# Patient Record
Sex: Female | Born: 1955 | Race: White | Hispanic: No | Marital: Married | Smoking: Never smoker
Health system: Southern US, Community
[De-identification: ages and names within clinical notes are randomized; demographics above are authoritative.]

## PROBLEM LIST (undated history)

## (undated) DIAGNOSIS — I639 Cerebral infarction, unspecified: Secondary | ICD-10-CM

## (undated) DIAGNOSIS — M1990 Unspecified osteoarthritis, unspecified site: Secondary | ICD-10-CM

## (undated) DIAGNOSIS — I10 Essential (primary) hypertension: Secondary | ICD-10-CM

## (undated) HISTORY — PX: OTHER SURGICAL HISTORY: SHX169

## (undated) HISTORY — PX: ABDOMINAL HYSTERECTOMY: SHX81

## (undated) HISTORY — PX: CERVICAL DISCECTOMY: SHX98

---

## 1999-12-30 ENCOUNTER — Other Ambulatory Visit: Admission: RE | Admit: 1999-12-30 | Discharge: 1999-12-30 | Payer: Self-pay | Admitting: Obstetrics & Gynecology

## 2000-04-25 ENCOUNTER — Encounter (INDEPENDENT_AMBULATORY_CARE_PROVIDER_SITE_OTHER): Payer: Self-pay

## 2000-04-25 ENCOUNTER — Other Ambulatory Visit: Admission: RE | Admit: 2000-04-25 | Discharge: 2000-04-25 | Payer: Self-pay | Admitting: Obstetrics & Gynecology

## 2000-11-22 ENCOUNTER — Other Ambulatory Visit: Admission: RE | Admit: 2000-11-22 | Discharge: 2000-11-22 | Payer: Self-pay | Admitting: Obstetrics & Gynecology

## 2007-01-06 ENCOUNTER — Ambulatory Visit (HOSPITAL_COMMUNITY): Admission: RE | Admit: 2007-01-06 | Discharge: 2007-01-06 | Payer: Self-pay | Admitting: Cardiology

## 2007-07-07 ENCOUNTER — Ambulatory Visit (HOSPITAL_COMMUNITY): Admission: RE | Admit: 2007-07-07 | Discharge: 2007-07-08 | Payer: Self-pay | Admitting: Cardiology

## 2008-07-03 ENCOUNTER — Ambulatory Visit (HOSPITAL_COMMUNITY): Admission: RE | Admit: 2008-07-03 | Discharge: 2008-07-04 | Payer: Self-pay | Admitting: Neurosurgery

## 2010-12-29 NOTE — Op Note (Signed)
NAMELEVORA, WERDEN                  ACCOUNT NO.:  [id number]   MEDICAL RECORD NO.:  [id number]          PATIENT TYPE:  OIB   LOCATION:  3523                         FACILITY:  [facility]   PHYSICIAN:  Hilda Lias, M.D.   DATE OF BIRTH:  1956-04-09   DATE OF PROCEDURE:  07/03/2008  DATE OF DISCHARGE:                               OPERATIVE REPORT   PREOPERATIVE DIAGNOSIS:  C4-C5, C5-C6 spondylosis with radiculopathy.   POSTOPERATIVE DIAGNOSIS:  C4-C5, C5-C6 spondylosis with radiculopathy.   PROCEDURE:  Anterior C4-5, C5-6 diskectomy, decompression of the spinal  cord, bilateral foraminotomy, interbody fusion with auto and allograft  plate, microscope.   SURGEON:  Hilda Lias, MD   ASSISTANT:  Coletta Memos, MD   CLINICAL HISTORY:  Ms. Clendenning is a lady who was in my office complaining  of neck pain worse in the left upper extremity, which is getting worse.  It started with a burning sensation on the thumb and index finger.  The  patient had conservative treatment without any improvement.  X-rays  showed spondylosis at the level of C4-5 and C5-6, a borderline at the  level of C6-7.  Surgery was advised.  The risks were explained to her  and her husband including possibility of no improvement whatsoever, need  for surgery, and failure of the plate, stroke, infection, and CSF leak.   PROCEDURE:  Ms. Buffalo was taken to the OR and after intubation, the left  side of the neck was cleaned with DuraPrep.  Drapes were applied.  Transverse incision was made through the skin, subcutaneous tissue, and  platysma.  Dissection was carried out.  We found 2 large anterior  osteophytes.  X-rays showed that the needle was at the level of C4-5.  From thereon, we removed the anterior osteophyte at C4-5 and C5-6.  We  brought the microscope into the area, we started at the level of C4-5.  There was almost no space, the anterior ligament was calcified.  Incision was made with a 15-blade to enter the disk  space, we had to use  the drill.  The drill was carried out all the way posteriorly.  A thick  ligament was also excised in the midline and decompression of the spinal  cord as well as the foramen was achieved.  At the end, we had plenty  room for the spinal cord as well as the level at both C5 nerve root.  At  the level of C5-6, we saw an anterior ligament also with incision, after  it was done, we entered the disk space and with the microscope and with  the drill, we drilled all the way posteriorly.  We found the posterior  ligament was calcified and it was large, attached to the dura mater at  the right paramedian level.  Incision was done using the 1-mm Kerrison  punch as well as the drill.  We were able to remove the ligament with  decompression of the C5-6 nerve root.  The dura mater was intact.  Then,  decompression of the left side was done and we found quite  a bit of  narrowing of the foramen in the left side.  At the end, we had good room  for both the C6 nerve root as well as spinal  cord.  The endplates were drilled and 2 pieces of allograft of 7 mm with  autograft and PBX was inserted.  This was followed by a plate using 5  screws.  Lateral cervical spine x-rays showed that the upper part of the  plate was in good position.  We accomplished good hemostasis, and then  the wound was closed with Vicryl and Steri-Strips.           ______________________________  Hilda Lias, M.D.     EB/MEDQ  D:  07/03/2008  T:  07/03/2008  Job:  [id number]

## 2010-12-29 NOTE — Cardiovascular Report (Signed)
NAME:  Sylvia Palmer                  ACCOUNT NO.:  [id number]   MEDICAL RECORD NO.:  [id number]          PATIENT TYPE:  OIB   LOCATION:  6531                         FACILITY:  [facility]   PHYSICIAN:  Vonna Kotyk R. Jacinto Halim, MD       DATE OF BIRTH:  [birth year]   DATE OF PROCEDURE:  07/07/2007  DATE OF DISCHARGE:  07/08/2007                            CARDIAC CATHETERIZATION   PROCEDURE PERFORMED:  Intracardiac echo-guided closure of atrial septal  defect.   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  Given her RV dilatation, we felt that the  closure of the fenestrated ASD and also PFO which was fairly large with  a very strongly positive right-to-left shunting by  double contrast and  color Doppler evaluation.  The patient was brought to the  catheterization lab for the atrial septal defect closure.   The RA saturation was 72% with an SVC saturation of 69%.  The PA  saturation was also measured at 69%; however, this was an error.  There  was significant step-up noted in the right atrium, SVC to right atrium,  suggestive of significant left-to-right shunting.   DATA:  The intracardiac echo data revealed a large patent foramen ovale  and also multiple fenestrated atrial septal defects.  There were 2 large  fenestrated defects, one very close to the PFO and one in the mid  septum, and another small-sized PFO in the posterior part of the  interatrial septum near the origin of the IVC.  Otherwise the cardiac  structures were normal.   INTERVENTION DATA:  Successful closure of the PFO and fenestrated atrial  septal defect with implantation of a 25 mm Helix septal occluder.  Post  deployment, excellent results were noted with closure of  PFO, three of  the fenestrated atrial septa, and also there was no residual right-to-  left shunting by double contrast or color Doppler evaluation.   There was a small residual fenestrated ASD that was left alone that was  posterior and close to the IVC.   I do not suspect that this defect is physiologically significant.   RECOMMENDATIONS:  The patient will be continued on aspirin indefinitely  and Plavix for at least a period of 3 months.  She will need  endocarditis prophylaxis for a period of 6 months.  Outpatient  echocardiogram will also be performed.  Her right ventricular dimension  will be closely followed and if she does indeed start to develop any  right ventricular enlargement or right ventricular dilatation or  enlargement, I could potentially consider her for the closure of the  small-size fenestrated atrial septal defect; however, my clinical  suspicion is that this is probably insignificant.   TECHNIQUE:  Proceeded with the usual sterile precautions using a 9-  French right femoral venous access and an 8-French right femoral venous  access.  Intracardiac echo probe was advanced throught the 9-French  sheath.  An intracardiac echocardiogram was performed and the structures  were carefully analyzed.  Using a multipurpose A2 catheter, I was able  to obtain SVC, RA, IVC saturations and also RV and PA saturations;  however, having obtained this, I had initially noted step-up from the  SVC to RA, hence, I decided to proceed with closure.  There was an error  in the estimation of the PA saturation; however, repeat right heart  catheterization was not performed as we had already entered the left  atrium through a large PFO.   I made multiple attempts to see if I could go through the central  fenestrated atrial septal defect.  Because of inability to do so, I was  able to go through a large PFO which measured out to be about 11 mm.  At  this point, we decided to implant a  25-mm Helix septal occluder, which  was deployed in the usual fashion after preparing and exchanging the 8-  [place] venous sheath to an 11-French sheath.  Post closure, a double  contrast color Doppler evaluation was also performed.  The Helix device  was released.  The patient tolerated the procedure.  There were no  immediate complications noted.  ACT was maintained at therapeutic range  during the entirety of the procedure.  Heparin was utilized for  anticoagulation.      Cristy Hilts. Jacinto Halim, MD  Electronically Signed     JRG/MEDQ  D:  07/07/2007  T:  07/08/2007  Job:  [id number]

## 2011-05-18 LAB — BASIC METABOLIC PANEL
BUN: 10
Calcium: 9.6
Chloride: 108
Creatinine, Ser: 1.13

## 2011-05-18 LAB — CBC
HCT: 38.7
MCHC: 33.6
MCV: 88.2

## 2011-05-25 LAB — POCT I-STAT 3, VENOUS BLOOD GAS (G3P V)
Acid-base deficit: 3 — ABNORMAL HIGH
Acid-base deficit: 4 — ABNORMAL HIGH
Acid-base deficit: 5 — ABNORMAL HIGH
Bicarbonate: 20.7
Bicarbonate: 21.2
Bicarbonate: 21.2
Bicarbonate: 22.2
O2 Saturation: 59
O2 Saturation: 63
O2 Saturation: 75
O2 Saturation: 75
Operator id: 256671
Operator id: 256671
Operator id: 256671
TCO2: 22
TCO2: 22
TCO2: 22
TCO2: 23
pCO2, Ven: 38.1 — ABNORMAL LOW
pCO2, Ven: 39.3 — ABNORMAL LOW
pH, Ven: 7.354 — ABNORMAL HIGH
pH, Ven: 7.359 — ABNORMAL HIGH
pO2, Ven: 32
pO2, Ven: 34
pO2, Ven: 43

## 2011-05-25 LAB — BASIC METABOLIC PANEL
CO2: 24
GFR calc Af Amer: >60
Sodium: 136

## 2011-05-25 LAB — CBC
HCT: 34.3 — ABNORMAL LOW
Hemoglobin: 11.7 — ABNORMAL LOW
MCHC: 34.2
MCV: 87.6
Platelets: 404 — ABNORMAL HIGH
RBC: 3.92
RDW: 13.3
WBC: 9.8

## 2011-12-28 ENCOUNTER — Other Ambulatory Visit: Payer: Self-pay | Admitting: Neurosurgery

## 2011-12-28 DIAGNOSIS — IMO0002 Reserved for concepts with insufficient information to code with codable children: Secondary | ICD-10-CM

## 2011-12-28 DIAGNOSIS — M545 Low back pain, unspecified: Secondary | ICD-10-CM

## 2011-12-28 DIAGNOSIS — M5137 Other intervertebral disc degeneration, lumbosacral region: Secondary | ICD-10-CM

## 2012-01-05 ENCOUNTER — Other Ambulatory Visit: Payer: Self-pay | Admitting: Neurosurgery

## 2012-01-05 DIAGNOSIS — M5410 Radiculopathy, site unspecified: Secondary | ICD-10-CM

## 2012-01-05 DIAGNOSIS — M549 Dorsalgia, unspecified: Secondary | ICD-10-CM

## 2012-01-06 ENCOUNTER — Other Ambulatory Visit: Payer: Self-pay

## 2012-01-11 ENCOUNTER — Ambulatory Visit
Admission: RE | Admit: 2012-01-11 | Discharge: 2012-01-11 | Disposition: A | Discharge status: Home / Self Care | Payer: BC Managed Care – PPO | Source: Ambulatory Visit | Attending: Neurosurgery | Admitting: Neurosurgery

## 2012-01-11 VITALS — BP 110/61 | HR 63

## 2012-01-11 DIAGNOSIS — M549 Dorsalgia, unspecified: Secondary | ICD-10-CM

## 2012-01-11 DIAGNOSIS — M5410 Radiculopathy, site unspecified: Secondary | ICD-10-CM

## 2012-01-11 MED ORDER — IOHEXOL 180 MG/ML  SOLN
15.0000 mL | Freq: Once | INTRAMUSCULAR | Status: AC | PRN
  Administered 2012-01-11: 15 mL via INTRATHECAL

## 2012-01-11 MED ORDER — DIAZEPAM 5 MG PO TABS
10.0000 mg | ORAL_TABLET | Freq: Once | ORAL | Status: AC
  Administered 2012-01-11: 10 mg via ORAL

## 2012-01-11 NOTE — Progress Notes (Signed)
Patient's mother present while getting patient ready for myelogram.  Discharge instructions explained to both of them.  Patient's DOB verified.  jkl

## 2012-01-11 NOTE — Discharge Instructions (Signed)

## 2012-05-05 ENCOUNTER — Other Ambulatory Visit: Payer: Self-pay | Admitting: Neurosurgery

## 2012-06-09 ENCOUNTER — Encounter (HOSPITAL_COMMUNITY): Payer: Self-pay

## 2012-06-12 ENCOUNTER — Ambulatory Visit (HOSPITAL_COMMUNITY)
Admission: RE | Admit: 2012-06-12 | Discharge: 2012-06-12 | Disposition: A | Discharge status: Home / Self Care | Payer: BC Managed Care – PPO | Source: Ambulatory Visit | Attending: Neurosurgery | Admitting: Neurosurgery

## 2012-06-12 ENCOUNTER — Encounter (HOSPITAL_COMMUNITY): Payer: Self-pay

## 2012-06-12 ENCOUNTER — Encounter (HOSPITAL_COMMUNITY)
Admission: RE | Admit: 2012-06-12 | Discharge: 2012-06-12 | Disposition: A | Discharge status: Home / Self Care | Payer: BC Managed Care – PPO | Source: Ambulatory Visit | Attending: Neurosurgery | Admitting: Neurosurgery

## 2012-06-12 DIAGNOSIS — Z01812 Encounter for preprocedural laboratory examination: Secondary | ICD-10-CM | POA: Insufficient documentation

## 2012-06-12 DIAGNOSIS — Z01818 Encounter for other preprocedural examination: Secondary | ICD-10-CM | POA: Insufficient documentation

## 2012-06-12 HISTORY — DX: Unspecified osteoarthritis, unspecified site: M19.90

## 2012-06-12 HISTORY — DX: Essential (primary) hypertension: I10

## 2012-06-12 HISTORY — DX: Cerebral infarction, unspecified: I63.9

## 2012-06-12 LAB — ABO/RH: ABO/RH(D): O POS

## 2012-06-12 LAB — CBC
Hemoglobin: 14 g/dL (ref 12.0–15.0)
MCH: 29.4 pg (ref 26.0–34.0)
MCHC: 33.7 g/dL (ref 30.0–36.0)
Platelets: 400 10*3/uL (ref 150–400)
RBC: 4.76 MIL/uL (ref 3.87–5.11)

## 2012-06-12 LAB — BASIC METABOLIC PANEL
Calcium: 9.5 mg/dL (ref 8.4–10.5)
GFR calc Af Amer: 72 mL/min — ABNORMAL LOW (ref >90)
GFR calc non Af Amer: 62 mL/min — ABNORMAL LOW (ref >90)
Glucose, Bld: 90 mg/dL (ref 70–99)
Potassium: 3.4 mEq/L — ABNORMAL LOW (ref 3.5–5.1)
Sodium: 137 mEq/L (ref 135–145)

## 2012-06-12 LAB — TYPE AND SCREEN
ABO/RH(D): O POS
Antibody Screen: NEGATIVE

## 2012-06-12 LAB — SURGICAL PCR SCREEN
MRSA, PCR: NEGATIVE
Staphylococcus aureus: NEGATIVE

## 2012-06-12 NOTE — Pre-Procedure Instructions (Signed)
20 TANAIYAH CHRISTIE  06/12/2012   Your procedure is scheduled on:  06-23-2012  Report to [facility] at 5:30 AM.  Call this number if you have problems the morning of surgery: [phone]   Remember:   Do not eat food or drink:After Midnight.      Take these medicines the morning of surgery with A SIP OF WATER: valium,pain medication as needed,   Do not wear jewelry, make-up or nail polish.  Do not wear lotions, powders, or perfumes. You may wear deodorant.  Do not shave 48 hours prior to surgery. Men may shave face and neck.  Do not bring valuables to the hospital.  Contacts, dentures or bridgework may not be worn into surgery.  Leave suitcase in the car. After surgery it may be brought to your room.   For patients admitted to the hospital, checkout time is 11:00 AM the day of discharge.      Special Instructions: Shower using CHG 2 nights before surgery and the night before surgery.  If you shower the day of surgery use CHG.  Use special wash - you have one bottle of CHG for all showers.  You should use approximately 1/3 of the bottle for each shower.     Please read over the following fact sheets that you were given: Pain Booklet, Coughing and Deep Breathing, Blood Transfusion Information, MRSA Information and Surgical Site Infection Prevention

## 2012-06-12 NOTE — Progress Notes (Signed)
No records found at Dr. Verl Dicker office

## 2012-06-12 NOTE — Progress Notes (Signed)
Requested OV and any cardiac testing from Dr. Verl Dicker office.   Requested Stress Test and Echo from [facility].

## 2012-06-22 MED ORDER — CEFAZOLIN SODIUM-DEXTROSE 2-3 GM-% IV SOLR
2.0000 g | INTRAVENOUS | Status: AC
  Administered 2012-06-23 (×2): 2 g via INTRAVENOUS
  Filled 2012-06-22: qty 50

## 2012-06-23 ENCOUNTER — Encounter (HOSPITAL_COMMUNITY): Payer: Self-pay | Admitting: Anesthesiology

## 2012-06-23 ENCOUNTER — Ambulatory Visit (HOSPITAL_COMMUNITY): Payer: BC Managed Care – PPO | Admitting: Anesthesiology

## 2012-06-23 ENCOUNTER — Encounter (HOSPITAL_COMMUNITY): Payer: Self-pay | Admitting: *Deleted

## 2012-06-23 ENCOUNTER — Encounter (HOSPITAL_COMMUNITY)
Admission: RE | Disposition: A | Discharge status: Home / Self Care | Payer: Self-pay | Source: Ambulatory Visit | Attending: Neurosurgery

## 2012-06-23 ENCOUNTER — Ambulatory Visit (HOSPITAL_COMMUNITY): Payer: BC Managed Care – PPO

## 2012-06-23 ENCOUNTER — Inpatient Hospital Stay (HOSPITAL_COMMUNITY)
Admission: RE | Admit: 2012-06-23 | Discharge: 2012-06-27 | DRG: 756 | Disposition: A | Discharge status: Home / Self Care | Payer: BC Managed Care – PPO | Source: Ambulatory Visit | Attending: Neurosurgery | Admitting: Neurosurgery

## 2012-06-23 DIAGNOSIS — M5137 Other intervertebral disc degeneration, lumbosacral region: Principal | ICD-10-CM | POA: Diagnosis present

## 2012-06-23 DIAGNOSIS — Z79899 Other long term (current) drug therapy: Secondary | ICD-10-CM

## 2012-06-23 DIAGNOSIS — I10 Essential (primary) hypertension: Secondary | ICD-10-CM | POA: Diagnosis present

## 2012-06-23 DIAGNOSIS — Z8673 Personal history of transient ischemic attack (TIA), and cerebral infarction without residual deficits: Secondary | ICD-10-CM

## 2012-06-23 DIAGNOSIS — M51379 Other intervertebral disc degeneration, lumbosacral region without mention of lumbar back pain or lower extremity pain: Principal | ICD-10-CM | POA: Diagnosis present

## 2012-06-23 DIAGNOSIS — M129 Arthropathy, unspecified: Secondary | ICD-10-CM | POA: Diagnosis present

## 2012-06-23 SURGERY — POSTERIOR LUMBAR FUSION 2 LEVEL
Anesthesia: General | Site: Back | Wound class: Clean

## 2012-06-23 MED ORDER — 0.9 % SODIUM CHLORIDE (POUR BTL) OPTIME
TOPICAL | Status: DC | PRN
  Administered 2012-06-23 (×2): 1000 mL

## 2012-06-23 MED ORDER — MENTHOL 3 MG MT LOZG: 1.0000 | LOZENGE | OROMUCOSAL | Status: DC | PRN

## 2012-06-23 MED ORDER — OXYCODONE HCL 5 MG PO TABS
10.0000 mg | ORAL_TABLET | ORAL | Status: DC | PRN
  Administered 2012-06-23: 5 mg via ORAL
  Filled 2012-06-23: qty 1

## 2012-06-23 MED ORDER — ONDANSETRON HCL 4 MG/2ML IJ SOLN
4.0000 mg | Freq: Four times a day (QID) | INTRAMUSCULAR | Status: DC | PRN
  Administered 2012-06-23: 4 mg via INTRAVENOUS

## 2012-06-23 MED ORDER — SODIUM CHLORIDE 0.9 % IJ SOLN: 3.0000 mL | INTRAMUSCULAR | Status: DC | PRN

## 2012-06-23 MED ORDER — ROCURONIUM BROMIDE 100 MG/10ML IV SOLN
INTRAVENOUS | Status: DC | PRN
  Administered 2012-06-23: 50 mg via INTRAVENOUS

## 2012-06-23 MED ORDER — OXYCODONE HCL 5 MG PO TABS: 5.0000 mg | ORAL_TABLET | Freq: Once | ORAL | Status: DC | PRN

## 2012-06-23 MED ORDER — OXYCODONE-ACETAMINOPHEN 5-325 MG PO TABS
1.0000 | ORAL_TABLET | ORAL | Status: DC | PRN
  Administered 2012-06-23 – 2012-06-24 (×4): 1 via ORAL
  Filled 2012-06-23 (×4): qty 1

## 2012-06-23 MED ORDER — PHENOL 1.4 % MT LIQD: 1.0000 | OROMUCOSAL | Status: DC | PRN

## 2012-06-23 MED ORDER — ONDANSETRON HCL 4 MG/2ML IJ SOLN
INTRAMUSCULAR | Status: DC | PRN
  Administered 2012-06-23: 4 mg via INTRAVENOUS

## 2012-06-23 MED ORDER — ACETAMINOPHEN 325 MG PO TABS: 650.0000 mg | ORAL_TABLET | ORAL | Status: DC | PRN

## 2012-06-23 MED ORDER — IRBESARTAN 150 MG PO TABS
150.0000 mg | ORAL_TABLET | Freq: Every day | ORAL | Status: DC
  Administered 2012-06-24 – 2012-06-27 (×3): 150 mg via ORAL
  Filled 2012-06-23 (×4): qty 1

## 2012-06-23 MED ORDER — DIPHENHYDRAMINE HCL 50 MG/ML IJ SOLN: 12.5000 mg | Freq: Four times a day (QID) | INTRAMUSCULAR | Status: DC | PRN

## 2012-06-23 MED ORDER — LIDOCAINE HCL (CARDIAC) 20 MG/ML IV SOLN
INTRAVENOUS | Status: DC | PRN
  Administered 2012-06-23: 60 mg via INTRAVENOUS
  Administered 2012-06-23: 10 mg via INTRAVENOUS

## 2012-06-23 MED ORDER — ONDANSETRON HCL 4 MG/2ML IJ SOLN
4.0000 mg | INTRAMUSCULAR | Status: DC | PRN
  Administered 2012-06-23 – 2012-06-25 (×2): 4 mg via INTRAVENOUS
  Filled 2012-06-23 (×3): qty 2

## 2012-06-23 MED ORDER — SODIUM CHLORIDE 0.9 % IV SOLN: 250.0000 mL | INTRAVENOUS | Status: DC

## 2012-06-23 MED ORDER — DIPHENHYDRAMINE HCL 12.5 MG/5ML PO ELIX: 12.5000 mg | ORAL_SOLUTION | Freq: Four times a day (QID) | ORAL | Status: DC | PRN

## 2012-06-23 MED ORDER — NEOSTIGMINE METHYLSULFATE 1 MG/ML IJ SOLN
INTRAMUSCULAR | Status: DC | PRN
  Administered 2012-06-23: 3 mg via INTRAVENOUS

## 2012-06-23 MED ORDER — PROPOFOL 10 MG/ML IV BOLUS
INTRAVENOUS | Status: DC | PRN
  Administered 2012-06-23: 170 mg via INTRAVENOUS

## 2012-06-23 MED ORDER — VECURONIUM BROMIDE 10 MG IV SOLR
INTRAVENOUS | Status: DC | PRN
  Administered 2012-06-23 (×2): 2 mg via INTRAVENOUS

## 2012-06-23 MED ORDER — NALOXONE HCL 0.4 MG/ML IJ SOLN: 0.4000 mg | INTRAMUSCULAR | Status: DC | PRN

## 2012-06-23 MED ORDER — ACETAMINOPHEN 650 MG RE SUPP: 650.0000 mg | RECTAL | Status: DC | PRN

## 2012-06-23 MED ORDER — CEFAZOLIN SODIUM-DEXTROSE 2-3 GM-% IV SOLR
INTRAVENOUS | Status: AC
  Filled 2012-06-23: qty 50

## 2012-06-23 MED ORDER — ARTIFICIAL TEARS OP OINT
TOPICAL_OINTMENT | OPHTHALMIC | Status: DC | PRN
  Administered 2012-06-23: 1 via OPHTHALMIC

## 2012-06-23 MED ORDER — ONDANSETRON HCL 4 MG/2ML IJ SOLN
INTRAMUSCULAR | Status: AC
  Filled 2012-06-23: qty 2

## 2012-06-23 MED ORDER — VALSARTAN-HYDROCHLOROTHIAZIDE 160-12.5 MG PO TABS
1.0000 | ORAL_TABLET | Freq: Every day | ORAL | Status: DC
Start: 2012-06-23 — End: 2012-06-23

## 2012-06-23 MED ORDER — THROMBIN 20000 UNITS EX SOLR
CUTANEOUS | Status: DC | PRN
  Administered 2012-06-23: 08:00:00 via TOPICAL

## 2012-06-23 MED ORDER — MIDAZOLAM HCL 5 MG/5ML IJ SOLN
INTRAMUSCULAR | Status: DC | PRN
  Administered 2012-06-23: 2 mg via INTRAVENOUS

## 2012-06-23 MED ORDER — SUFENTANIL CITRATE 50 MCG/ML IV SOLN
INTRAVENOUS | Status: DC | PRN
  Administered 2012-06-23 (×6): 10 ug via INTRAVENOUS

## 2012-06-23 MED ORDER — OXYCODONE HCL 5 MG/5ML PO SOLN: 5.0000 mg | Freq: Once | ORAL | Status: DC | PRN

## 2012-06-23 MED ORDER — SODIUM CHLORIDE 0.9 % IJ SOLN: 9.0000 mL | INTRAMUSCULAR | Status: DC | PRN

## 2012-06-23 MED ORDER — HYDROMORPHONE 0.3 MG/ML IV SOLN
INTRAVENOUS | Status: AC
  Filled 2012-06-23: qty 25

## 2012-06-23 MED ORDER — ASPIRIN EC 81 MG PO TBEC
81.0000 mg | DELAYED_RELEASE_TABLET | Freq: Every day | ORAL | Status: DC
  Administered 2012-06-24 – 2012-06-27 (×4): 81 mg via ORAL
  Filled 2012-06-23 (×4): qty 1

## 2012-06-23 MED ORDER — HYDROMORPHONE HCL PF 1 MG/ML IJ SOLN: 0.2500 mg | INTRAMUSCULAR | Status: DC | PRN

## 2012-06-23 MED ORDER — ALBUMIN HUMAN 5 % IV SOLN
INTRAVENOUS | Status: DC | PRN
  Administered 2012-06-23: 10:00:00 via INTRAVENOUS

## 2012-06-23 MED ORDER — ACETAMINOPHEN 10 MG/ML IV SOLN
INTRAVENOUS | Status: AC
  Filled 2012-06-23: qty 100

## 2012-06-23 MED ORDER — ONDANSETRON HCL 4 MG/2ML IJ SOLN: 4.0000 mg | Freq: Four times a day (QID) | INTRAMUSCULAR | Status: DC | PRN

## 2012-06-23 MED ORDER — GLYCOPYRROLATE 0.2 MG/ML IJ SOLN
INTRAMUSCULAR | Status: DC | PRN
  Administered 2012-06-23: 0.4 mg via INTRAVENOUS

## 2012-06-23 MED ORDER — DEXAMETHASONE SODIUM PHOSPHATE 4 MG/ML IJ SOLN
INTRAMUSCULAR | Status: DC | PRN
  Administered 2012-06-23: 4 mg via INTRAVENOUS

## 2012-06-23 MED ORDER — ACETAMINOPHEN 10 MG/ML IV SOLN
1000.0000 mg | Freq: Once | INTRAVENOUS | Status: AC
  Administered 2012-06-23: 1000 mg via INTRAVENOUS
  Filled 2012-06-23: qty 100

## 2012-06-23 MED ORDER — DIAZEPAM 5 MG PO TABS
5.0000 mg | ORAL_TABLET | Freq: Four times a day (QID) | ORAL | Status: DC | PRN
  Administered 2012-06-23 – 2012-06-24 (×2): 5 mg via ORAL
  Filled 2012-06-23 (×2): qty 1

## 2012-06-23 MED ORDER — HEMOSTATIC AGENTS (NO CHARGE) OPTIME
TOPICAL | Status: DC | PRN
  Administered 2012-06-23: 1 via TOPICAL

## 2012-06-23 MED ORDER — SODIUM CHLORIDE 0.9 % IJ SOLN
3.0000 mL | Freq: Two times a day (BID) | INTRAMUSCULAR | Status: DC
  Administered 2012-06-24 – 2012-06-26 (×4): 3 mL via INTRAVENOUS

## 2012-06-23 MED ORDER — SODIUM CHLORIDE 0.9 % IV SOLN
INTRAVENOUS | Status: DC
  Administered 2012-06-23 – 2012-06-24 (×2): via INTRAVENOUS

## 2012-06-23 MED ORDER — CEFAZOLIN SODIUM 1-5 GM-% IV SOLN
1.0000 g | Freq: Three times a day (TID) | INTRAVENOUS | Status: AC
  Administered 2012-06-23 – 2012-06-24 (×2): 1 g via INTRAVENOUS
  Filled 2012-06-23 (×2): qty 50

## 2012-06-23 MED ORDER — HYDROCHLOROTHIAZIDE 12.5 MG PO CAPS
12.5000 mg | ORAL_CAPSULE | Freq: Every day | ORAL | Status: DC
  Administered 2012-06-24 – 2012-06-27 (×3): 12.5 mg via ORAL
  Filled 2012-06-23 (×4): qty 1

## 2012-06-23 MED ORDER — ZOLPIDEM TARTRATE 10 MG PO TABS: 10.0000 mg | ORAL_TABLET | Freq: Every evening | ORAL | Status: DC | PRN

## 2012-06-23 MED ORDER — HYDROMORPHONE 0.3 MG/ML IV SOLN
INTRAVENOUS | Status: DC
Start: 2012-06-23 — End: 2012-06-23
  Administered 2012-06-23: 1.8 mg via INTRAVENOUS
  Administered 2012-06-23: 12:00:00 via INTRAVENOUS

## 2012-06-23 MED ORDER — LACTATED RINGERS IV SOLN
INTRAVENOUS | Status: DC | PRN
  Administered 2012-06-23 (×3): via INTRAVENOUS

## 2012-06-23 MED ORDER — BUPIVACAINE LIPOSOME 1.3 % IJ SUSP
20.0000 mL | Freq: Once | INTRAMUSCULAR | Status: AC
  Administered 2012-06-23: 20 mL
  Filled 2012-06-23: qty 20

## 2012-06-23 SURGICAL SUPPLY — 69 items
APL SKNCLS STERI-STRIP NONHPOA (GAUZE/BANDAGES/DRESSINGS) ×1
BENZOIN TINCTURE PRP APPL 2/3 (GAUZE/BANDAGES/DRESSINGS) ×2 IMPLANT
BLADE SURG ROTATE 9660 (MISCELLANEOUS) IMPLANT
BONE EQUIVA 10CC (Bone Implant) ×1 IMPLANT
BUR ACORN 6.0 (BURR) ×2 IMPLANT
BUR MATCHSTICK NEURO 3.0 LAGG (BURR) ×2 IMPLANT
CANISTER SUCTION 2500CC (MISCELLANEOUS) ×2 IMPLANT
CAP REVERE LOCKING (Cap) ×6 IMPLANT
CLOTH BEACON ORANGE TIMEOUT ST (SAFETY) ×2 IMPLANT
CONN CROSSLINK REV 6.35 48-60 (Connector) ×2 IMPLANT
CONNECTOR CRSLNK REV6.35 48-60 (Connector) IMPLANT
CONT SPEC 4OZ CLIKSEAL STRL BL (MISCELLANEOUS) ×2 IMPLANT
COVER BACK TABLE 24X17X13 BIG (DRAPES) IMPLANT
COVER TABLE BACK 60X90 (DRAPES) ×2 IMPLANT
DRAPE C-ARM 42X72 X-RAY (DRAPES) ×4 IMPLANT
DRAPE LAPAROTOMY 100X72X124 (DRAPES) ×2 IMPLANT
DRAPE POUCH INSTRU U-SHP 10X18 (DRAPES) ×2 IMPLANT
DRSG PAD ABDOMINAL 8X10 ST (GAUZE/BANDAGES/DRESSINGS) ×2 IMPLANT
DURAPREP 26ML APPLICATOR (WOUND CARE) ×2 IMPLANT
ELECT REM PT RETURN 9FT ADLT (ELECTROSURGICAL) ×2
ELECTRODE REM PT RTRN 9FT ADLT (ELECTROSURGICAL) ×1 IMPLANT
EVACUATOR 1/8 PVC DRAIN (DRAIN) IMPLANT
GAUZE SPONGE 4X4 16PLY XRAY LF (GAUZE/BANDAGES/DRESSINGS) ×2 IMPLANT
GLOVE BIOGEL M 8.0 STRL (GLOVE) ×1 IMPLANT
GLOVE BIOGEL PI IND STRL 7.5 (GLOVE) ×4 IMPLANT
GLOVE BIOGEL PI IND STRL 8 (GLOVE) IMPLANT
GLOVE BIOGEL PI INDICATOR 7.5 (GLOVE) ×4
GLOVE BIOGEL PI INDICATOR 8 (GLOVE) ×3
GLOVE EXAM NITRILE LRG STRL (GLOVE) IMPLANT
GLOVE EXAM NITRILE MD LF STRL (GLOVE) ×1 IMPLANT
GLOVE EXAM NITRILE XL STR (GLOVE) IMPLANT
GLOVE EXAM NITRILE XS STR PU (GLOVE) IMPLANT
GOWN BRE IMP SLV AUR LG STRL (GOWN DISPOSABLE) ×2 IMPLANT
GOWN BRE IMP SLV AUR XL STRL (GOWN DISPOSABLE) ×4 IMPLANT
GOWN STRL REIN 2XL LVL4 (GOWN DISPOSABLE) ×1 IMPLANT
KIT BASIN OR (CUSTOM PROCEDURE TRAY) ×2 IMPLANT
KIT ROOM TURNOVER OR (KITS) ×2 IMPLANT
MILL MEDIUM DISP (BLADE) ×1 IMPLANT
NDL HYPO 18GX1.5 BLUNT FILL (NEEDLE) IMPLANT
NDL HYPO 21X1.5 SAFETY (NEEDLE) IMPLANT
NDL HYPO 25X1 1.5 SAFETY (NEEDLE) IMPLANT
NEEDLE HYPO 18GX1.5 BLUNT FILL (NEEDLE) IMPLANT
NEEDLE HYPO 21X1.5 SAFETY (NEEDLE) ×2 IMPLANT
NEEDLE HYPO 25X1 1.5 SAFETY (NEEDLE) ×2 IMPLANT
NS IRRIG 1000ML POUR BTL (IV SOLUTION) ×3 IMPLANT
PACK LAMINECTOMY NEURO (CUSTOM PROCEDURE TRAY) ×2 IMPLANT
PAD ARMBOARD 7.5X6 YLW CONV (MISCELLANEOUS) ×6 IMPLANT
PATTIES SURGICAL .5 X1 (DISPOSABLE) ×2 IMPLANT
PATTIES SURGICAL .5 X3 (DISPOSABLE) IMPLANT
ROD REVERSE 6.35 STRAIGHT 75MM (Rod) ×4 IMPLANT
SCREW 4.5X40 (Screw) ×6 IMPLANT
SPACER SUSTAIN O SML 8X22 8MM (Spacer) ×4 IMPLANT
SPONGE GAUZE 4X4 12PLY (GAUZE/BANDAGES/DRESSINGS) ×2 IMPLANT
SPONGE LAP 4X18 X RAY DECT (DISPOSABLE) IMPLANT
SPONGE NEURO XRAY DETECT 1X3 (DISPOSABLE) IMPLANT
SPONGE SURGIFOAM ABS GEL 100 (HEMOSTASIS) ×2 IMPLANT
STRIP CLOSURE SKIN 1/2X4 (GAUZE/BANDAGES/DRESSINGS) ×2 IMPLANT
SUT VIC AB 1 CT1 18XBRD ANBCTR (SUTURE) ×2 IMPLANT
SUT VIC AB 1 CT1 8-18 (SUTURE) ×4
SUT VIC AB 2-0 CP2 18 (SUTURE) ×3 IMPLANT
SUT VIC AB 3-0 SH 8-18 (SUTURE) ×2 IMPLANT
SYR 20CC LL (SYRINGE) ×1 IMPLANT
SYR 20ML ECCENTRIC (SYRINGE) ×2 IMPLANT
SYR 5ML LL (SYRINGE) IMPLANT
TOWEL OR 17X24 6PK STRL BLUE (TOWEL DISPOSABLE) ×2 IMPLANT
TOWEL OR 17X26 10 PK STRL BLUE (TOWEL DISPOSABLE) ×2 IMPLANT
TRAY FOLEY BAG SILVER LF 14FR (CATHETERS) ×1 IMPLANT
TRAY FOLEY CATH 14FRSI W/METER (CATHETERS) ×1 IMPLANT
WATER STERILE IRR 1000ML POUR (IV SOLUTION) ×2 IMPLANT

## 2012-06-23 NOTE — Preoperative (Signed)
Beta Blockers   Reason not to administer Beta Blockers:Not Applicable 

## 2012-06-23 NOTE — H&P (Signed)
Sylvia Palmer is an 56 y.o. female.   Chief Complaint: lbp HPI: lower back pain with radiation to both pxoximal legs, no better with conservative treatment. Radiological work up showed degenerative disc disease at l1-2 and l2-3. Patient wants to proceed with surgery  Past Medical History  Diagnosis Date  . Hypertension   . Stroke     mini strokes  . Arthritis     Past Surgical History  Procedure Date  . Abdominal hysterectomy   . Other surgical history     implantable cardiac septal occluder times 4(holes in Heart)  . Cervical discectomy     History reviewed. No pertinent family history. Social History:  reports that she has never smoked. She does not have any smokeless tobacco history on file. She reports that she does not drink alcohol or use illicit drugs.  Allergies:  Allergies  Allergen Reactions  . Morphine And Related Nausea And Vomiting  . Latex Rash    itching    Medications Prior to Admission  Medication Sig Dispense Refill  . oxyCODONE-acetaminophen (PERCOCET) 7.5-325 MG per tablet Take 0.5-1 tablets by mouth 2 (two) times daily as needed. pain      . valsartan-hydrochlorothiazide (DIOVAN-HCT) 160-12.5 MG per tablet Take 1 tablet by mouth daily.      Marland Kitchen aspirin EC 81 MG tablet Take 81 mg by mouth daily.      . Chlorpheniramine-Acetaminophen (CORICIDIN HBP COLD/FLU PO) Take 1 tablet by mouth daily as needed. Cold symptoms      . diazepam (VALIUM) 10 MG tablet Take 10 mg by mouth at bedtime as needed. spasm      . diazepam (VALIUM) 5 MG tablet Take 5 mg by mouth at bedtime as needed. spasm        No results found for this or any previous visit (from the past 48 hour(s)). No results found.  Review of Systems  Constitutional: Negative.   HENT: Negative.   Eyes: Negative.   Respiratory: Negative.   Cardiovascular: Negative.   Gastrointestinal: Negative.   Genitourinary: Negative.   Musculoskeletal: Positive for back pain.  Skin: Negative.   Neurological:  Positive for focal weakness.  Endo/Heme/Allergies: Negative.   Psychiatric/Behavioral: Negative.     Blood pressure 128/72, pulse 69, temperature 98.3 F (36.8 C), temperature source Oral, resp. rate 20, SpO2 100.00%. Physical Exam hent, nl. Neck, anterior scar with some decrease of mobility. Cv, nl. Lungs, clear. Abdomen, nl. Extremities, nl. NEURO WEAKNESS OF QUADRICEPS . Femoral maneuver positive Assessment/Plandecompression and fusion at l1-2 and l2-3 with cages and pedicles scews and pla. She and her family are aware of risks and benefits  Aram Domzalski M 06/23/2012, 7:47 AM

## 2012-06-23 NOTE — Transfer of Care (Signed)
Immediate Anesthesia Transfer of Care Note  Patient: Sylvia Palmer  Procedure(s) Performed: Procedure(s) (LRB) with comments: POSTERIOR LUMBAR FUSION 2 LEVEL (N/A) - Lumbar one-two Lumbar two-three Diskectomy,fusion, cages, posterolateral arthrodesis, pedicle screws, cellsaver  Patient Location: PACU  Anesthesia Type:General  Level of Consciousness: awake and alert   Airway & Oxygen Therapy: Patient Spontanous Breathing and Patient connected to nasal cannula oxygen  Post-op Assessment: Report given to PACU RN and Post -op Vital signs reviewed and stable  Post vital signs: Reviewed and stable  Complications: No apparent anesthesia complications

## 2012-06-23 NOTE — Anesthesia Procedure Notes (Signed)
Procedure Name: Intubation Date/Time: 06/23/2012 8:07 AM Performed by: Romie Minus K Pre-anesthesia Checklist: Patient identified, Emergency Drugs available, Suction available, Patient being monitored and Timeout performed Patient Re-evaluated:Patient Re-evaluated prior to inductionOxygen Delivery Method: Circle system utilized Preoxygenation: Pre-oxygenation with 100% oxygen Intubation Type: IV induction Ventilation: Mask ventilation without difficulty Laryngoscope Size: Miller and 2 Grade View: Grade I Tube type: Oral Tube size: 7.5 mm Number of attempts: 1 Airway Equipment and Method: Stylet Placement Confirmation: ETT inserted through vocal cords under direct vision,  positive ETCO2 and breath sounds checked- equal and bilateral Secured at: 20 cm Tube secured with: Tape Dental Injury: Teeth and Oropharynx as per pre-operative assessment

## 2012-06-23 NOTE — Clinical Social Work Note (Signed)
Clinical Social Work  CSW received consult for SNF. CSW reviewed chart. Awaiting PT/OT evals for discharge recommendations. CSW will assess for SNF, if appropriate. Please call with any urgent needs.   Dede Query, MSW, Theresia Majors [phone]

## 2012-06-23 NOTE — Anesthesia Postprocedure Evaluation (Signed)
Anesthesia Post Note  Patient: Sylvia Palmer  Procedure(s) Performed: Procedure(s) (LRB): POSTERIOR LUMBAR FUSION 2 LEVEL (N/A)  Anesthesia type: General  Patient location: PACU  Post pain: Pain level controlled and Adequate analgesia  Post assessment: Post-op Vital signs reviewed, Patient's Cardiovascular Status Stable, Respiratory Function Stable, Patent Airway and Pain level controlled  Last Vitals:  Filed Vitals:   06/23/12 1130  BP: 107/52  Pulse: 66  Temp: 36.2 C  Resp: 12    Post vital signs: Reviewed and stable  Level of consciousness: awake, alert  and oriented  Complications: No apparent anesthesia complications

## 2012-06-23 NOTE — Progress Notes (Signed)
Op note [id number]

## 2012-06-23 NOTE — Anesthesia Preprocedure Evaluation (Addendum)
Anesthesia Evaluation  Patient identified by MRN, date of birth, ID band Patient awake    Reviewed: Allergy & Precautions, H&P , NPO status , Patient's Chart, lab work & pertinent test results  History of Anesthesia Complications Negative for: history of anesthetic complications  Airway Mallampati: II TM Distance: >3 FB Neck ROM: full    Dental  (+) Teeth Intact and Dental Advisory Given   Pulmonary  breath sounds clear to auscultation        Cardiovascular hypertension, Pt. 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   Neuro/Psych Patient reports "ministrokes" before she had holes in her heart fixed in 2008. CVA, No Residual Symptoms    GI/Hepatic   Endo/Other  obese  Renal/GU      Musculoskeletal  (+) Arthritis -,   Abdominal   Peds  Hematology   Anesthesia Other Findings   Reproductive/Obstetrics                          Anesthesia Physical Anesthesia Plan  ASA: II  Anesthesia Plan: General   Post-op Pain Management:    Induction: Intravenous  Airway Management Planned: Oral ETT  Additional Equipment:   Intra-op Plan:   Post-operative Plan: Extubation in OR  Informed Consent: I have reviewed the patients History and Physical, chart, labs and discussed the procedure including the risks, benefits and alternatives for the proposed anesthesia with the patient or authorized representative who has indicated his/her understanding and acceptance.     Plan Discussed with: CRNA and Surgeon  Anesthesia Plan Comments:         Anesthesia Quick Evaluation

## 2012-06-24 MED ORDER — OXYCODONE HCL 5 MG PO TABS: 5.0000 mg | ORAL_TABLET | ORAL | Status: DC | PRN

## 2012-06-24 NOTE — Progress Notes (Signed)
Occupational Therapy Evaluation Patient Details Name: Sylvia Palmer MRN: [id number] DOB: 02/02/1956 Today's Date: 06/24/2012 Time: 4098-1191 OT Time Calculation (min): 29 min  OT Assessment / Plan / Recommendation Clinical Impression  56 yo s/p B L1 - 2 laminectomy and facetectomy, B L1-3 diskectomy, decompression L1-3,post arthrodesis; fusion L1-3 with pedicle screws and autograft.  Pt orthostatic with BP 86/51 in sitting;symptomatic. Pt will benefit from skilled OT services to max independence with ADL and functional mobility to facilitate D/C home with 24.7 support of husband.     OT Assessment  Patient needs continued OT Services    Follow Up Recommendations  Home health OT (pending progress)    Barriers to Discharge None    Equipment Recommendations  Rolling walker with 5" wheels    Recommendations for Other Services  none  Frequency  Min 2X/week    Precautions / Restrictions Precautions Precautions: Back Precaution Booklet Issued: Yes (comment) Required Braces or Orthoses: Spinal Brace Restrictions Weight Bearing Restrictions: No   Pertinent Vitals/Pain 6/10. Pain meds given. BP 86/51 sitting.     ADL  Grooming: Set up;Supervision/safety Where Assessed - Grooming: Supported sitting Upper Body Bathing: Minimal assistance Where Assessed - Upper Body Bathing: Supported sitting Lower Body Bathing: Maximal assistance Where Assessed - Lower Body Bathing: Supported sitting Upper Body Dressing: Minimal assistance Where Assessed - Upper Body Dressing: Supported sitting Lower Body Dressing: Maximal assistance Transfers/Ambulation Related to ADLs: only completed bed mobility due to BP drop and c/o n/v ADL Comments: no knowledge of back precautions and ADL    OT Diagnosis: Generalized weakness;Acute pain  OT Problem List: Decreased activity tolerance;Decreased safety awareness;Decreased knowledge of use of DME or AE;Decreased knowledge of precautions;Cardiopulmonary status  limiting activity;Obesity;Pain OT Treatment Interventions: Self-care/ADL training;Energy conservation;DME and/or AE instruction;Therapeutic activities;Patient/family education   OT Goals Acute Rehab OT Goals OT Goal Formulation: With patient Time For Goal Achievement: 07/01/12 Potential to Achieve Goals: Good ADL Goals Pt Will Perform Grooming: with modified independence;Unsupported;Standing at sink ADL Goal: Grooming - Progress: Goal set today Pt Will Perform Lower Body Bathing: with supervision;with caregiver independent in assisting;Sit to stand from chair;Unsupported;with adaptive equipment ADL Goal: Lower Body Bathing - Progress: Goal set today Pt Will Perform Lower Body Dressing: with supervision;with caregiver independent in assisting;Sit to stand from chair;Unsupported;with adaptive equipment ADL Goal: Lower Body Dressing - Progress: Goal set today Pt Will Transfer to Toilet: Ambulation;3-in-1;with modified independence;Maintaining back safety precautions ADL Goal: Toilet Transfer - Progress: Goal set today Pt Will Perform Toileting - Clothing Manipulation: with supervision;with adaptive equipment;Sitting on 3-in-1 or toilet;Standing ADL Goal: Toileting - Clothing Manipulation - Progress: Goal set today Pt Will Perform Toileting - Hygiene: with supervision;Sit to stand from 3-in-1/toilet;with adaptive equipment ADL Goal: Toileting - Hygiene - Progress: Goal set today Additional ADL Goal #1: Pt will verbalize 3/3 back precautions independently. ADL Goal: Additional Goal #1 - Progress: Goal set today  Visit Information  Last OT Received On: 06/24/12    Subjective Data      Prior Functioning     Home Living Lives With: Spouse Available Help at Discharge: Family Type of Home: House Home Access: Stairs to enter [employer] of Steps: 3 Entrance Stairs-Rails: Can reach both Home Layout: One level Bathroom Shower/Tub: Walk-in shower;Tub/shower unit;Door [employer]  Toilet: Standard Home Adaptive Equipment: Walker - standard;Wheelchair - manual;Shower chair with back;Hand-held shower hose;Bedside commode/3-in-1;Grab bars in shower Prior Function Level of Independence: Independent Able to Take Stairs?: Yes Driving: Yes Vocation: Full time employment Communication Communication: No difficulties  Dominant Hand: Right         Vision/Perception  WFL   Cognition  Overall Cognitive Status: Appears within functional limits for tasks assessed/performed Arousal/Alertness: Awake/alert Orientation Level: Appears intact for tasks assessed Behavior During Session: [facility] for tasks performed    Extremity/Trunk Assessment Right Upper Extremity Assessment RUE ROM/Strength/Tone: WFL for tasks assessed RUE Sensation: WFL - Light Touch;WFL - Proprioception RUE Coordination: WFL - gross/fine motor Left Upper Extremity Assessment LUE ROM/Strength/Tone: WFL for tasks assessed LUE Sensation: WFL - Light Touch;WFL - Proprioception LUE Coordination: WFL - gross/fine motor Right Lower Extremity Assessment RLE ROM/Strength/Tone: Deficits;Due to pain (c/o pain R hip) Left Lower Extremity Assessment LLE ROM/Strength/Tone: Deficits Trunk Assessment Trunk Assessment: Normal     Mobility Bed Mobility Bed Mobility: Left Sidelying to Sit;Rolling Left;Sitting - Scoot to Edge of Bed;Sit to Supine;Scooting to [facility] Rolling Left: 4: Min assist;With rail Left Sidelying to Sit: 3: Mod assist;HOB flat;With rails Sitting - Scoot to Edge of Bed: 4: Min assist Sit to Supine: 3: Mod assist;HOB flat Scooting to HOB: 1: +2 Total assist Scooting to [facility]: Patient Percentage: 10% Transfers Transfers: Not assessed (d/t BP)     Shoulder Instructions     Exercise     Balance     End of Session OT - End of Session Activity Tolerance: Patient limited by pain;Treatment limited secondary to medical complications (Comment) (BP 86/51) Patient left: in chair;with call bell/phone within  reach;with family/visitor present Nurse Communication: Mobility status;Precautions  GO     Shanayah Kaffenberger,HILLARY 06/24/2012, 11:39 AM Luisa Dago, OTR/L  [phone] 06/24/2012

## 2012-06-24 NOTE — Op Note (Signed)
NAMECAIRO, Sylvia Palmer                  ACCOUNT NO.:  [id number]  MEDICAL RECORD NO.:  [id number]  LOCATION:  [id number]                        FACILITY:  [facility]  PHYSICIAN:  Hilda Lias, M.D.   DATE OF BIRTH:  03/06/1956  DATE OF PROCEDURE:  06/23/2012 DATE OF DISCHARGE:                              OPERATIVE REPORT   PREOPERATIVE DIAGNOSES:  L1-L2, L2-L3 degenerative disk disease, chronic lumbar radiculopathy, failure with conservative treatment.  POSTOPERATIVE DIAGNOSES:  L1-L2, L2-L3 degenerative disk disease, chronic lumbar radiculopathy, failure with conservative treatment.  PROCEDURES:  Bilateral L1-L2 laminectomy and facetectomy; bilateral L1- L2, L2-L3 bilateral diskectomy beyond normal to be__________ able to insert the cages; decompression of the thecal sac;  decompression of the L1, L2, L3 nerve roots; interbody fusion with cages 8 x 22; pedicle screws L1, L2, L3; posterolateral arthrodesis with autograft and bone extender; C-arm.  SURGEON:  Hilda Lias, MD  ASSISTANT:  Dr. Gerlene Fee.  CLINICAL HISTORY:  The patient was admitted because of back pain radiating to both legs with weakness and was getting worse with conservative treatment.  The patient had difficulty walking and standing.  X-rays show severe degenerative disk disease at the level of L1-L2 and L2-L3.  Because of failure with conservative treatment, surgery was advised.  She was aware of the risks with the surgery including possibility of no improvement, CSF leak, infection, epidural hematoma, and damage to the conus with difficulty to urinate.  The patient declines second opinion.  This lady in the past had anterior cervical fusion by me.  PROCEDURE IN DETAIL:  The patient was taken to the OR, and after intubation, she was positioned in a prone manner.  The back was cleaned with DuraPrep.  Drapes were applied.  We did 2-3 x-rays to be sure that we are in the right area.  Then, an incision from T12 down to  L3-L4 was made and muscle was retracted all the way laterally until we were able to see and feel the transverse__________ process of L1, L2, L3.  Then, with the Leksell, we removed the spinous process of L1-L2, as well as the lamina. We went laterally and we removed quite a large hypertrophied facet. Then, at the level of L2-L3, first in the left side and then on the right side with retraction of the thecal sac, we were able to get into the disk space.  The area was quite narrow.  At the end, we were able to remove the disk as well as the endplate.  The same procedure was done at the level of L1-L2.  We introduced our 4 cages of 8 x 22 with autograft inside.  __c-arm________ x-ray showed good position of the cages.  Then using the C-arm in AP view and then a lateral view, we probed the pedicle of L1, L2, L3.  We were able to fill the hole to be sure that we were surrounded by bone in all 4 quadrants__________ quadrants.  Then, 6 screws of 4.5 x 40 were introduced, kept in place with a rod and caps.  We went laterally and we removed the periosteum of the lateral aspect of the facet of L1-L2, L2-L3, as well as  the transverse__________ process.  A mix of autograft and bone extender was used for arthrodesis.  Valsalva maneuver was negative.  The area was irrigated and the wound was closed with Vicryl and Steri-Strips.          ______________________________ Hilda Lias, M.D.     EB/MEDQ  D:  06/23/2012  T:  06/24/2012  Job:  [id number] beyond normal

## 2012-06-24 NOTE — Progress Notes (Signed)
Patient doing better today from pain, she has had intermittent  nausea each time she is turned,.she has been able to void after foley d/ced, though not that much, she is been encouraged to drink more fluids. Will keep monitoring her.

## 2012-06-24 NOTE — Progress Notes (Signed)
Subjective: Patient reports Centralized back pain from surgery but legs otherwise feel better to stop a 1  Objective: Vital signs in last 24 hours: Temp:  [97.2 F (36.2 C)-99.6 F (37.6 C)] 99.2 F (37.3 C) (11/09 0926) Pulse Rate:  [50-77] 71  (11/09 0926) Resp:  [12-20] 17  (11/09 0926) BP: (91-136)/(40-71) 91/40 mmHg (11/09 0926) SpO2:  [95 %-100 %] 100 % (11/09 0926) FiO2 (%):  [67 %] 67 % (11/08 1557) Weight:  [93.895 kg (207 lb)-94.6 kg (208 lb 8.9 oz)] 93.895 kg (207 lb) (11/08 1500)  Intake/Output from previous day: 11/08 0701 - 11/09 0700 In: 3220 [I.V.:2970; IV Piggyback:250] Out: 1075 [Urine:725; Blood:350] Intake/Output this shift:    Dressing dry and intact motor function good in iliopsoas quadriceps tibialis anterior and gastrocs at least 4/5  Lab Results: No results found for this basename: WBC:2,HGB:2,HCT:2,PLT:2 in the last 72 hours BMET No results found for this basename: NA:2,K:2,CL:2,CO2:2,GLUCOSE:2,BUN:2,CREATININE:2,CALCIUM:2 in the last 72 hours  Studies/Results: Dg Lumbar Spine 2-3 Views  06/23/2012  *RADIOLOGY REPORT*  Clinical Data: L1-2 and L2-3 PLIF  LUMBAR SPINE - 2-3 VIEW  Comparison: 01/11/2012  Findings: Initial film shows a needle at the upper spinous process of L1.  Second film shows clamps on the spinous processes of T12, L1 and L2.  IMPRESSION: T12, L1 and L2 spinous processes localized.   Original Report Authenticated By: Paulina Fusi, M.D.    Dg Lumbar Spine Complete  06/23/2012  *RADIOLOGY REPORT*  Clinical Data: PLIF L1-L3  LUMBAR SPINE - COMPLETE 4+ VIEW  Comparison: Same day  Findings: Headache multiple C-arm images submitted.  Initial film shows a probe posterior to the L2-3 disc level.  Second image shows probes posterior to the L1-2 and L2-3 disc levels.  Third film shows discectomy at those levels with interbody fusion material and placement of bilateral pedicle screws.  Fourth film shows a lateral projection of those same findings.   IMPRESSION: PLIF in progress L1-L3.   Original Report Authenticated By: Paulina Fusi, M.D.     Assessment/Plan: Stable postop day 1  LOS: 1 day  Continues PCA for pain control   Sylvia Palmer J 06/24/2012, 10:30 AM

## 2012-06-24 NOTE — Evaluation (Signed)
Physical Therapy Evaluation Patient Details Name: Sylvia Palmer MRN: [id number] DOB: December 02, 1955 Today's Date: 06/24/2012 Time: 1478-2956 PT Time Calculation (min): 20 min  PT Assessment / Plan / Recommendation Clinical Impression  Pt s/p PLf L1-3. Upon sitting, pt with dizziness and lighteadedness unresolved. BP taken: 86/53, therefore pt layed back down. Will continue assessment in future sessions. Pt will benefit from skilled PT in the acute care setting in order to maximize functional mobility and safety prior to d/c home    PT Assessment  Patient needs continued PT services    Follow Up Recommendations  Home health PT;Supervision/Assistance - 24 hour    Does the patient have the potential to tolerate intense rehabilitation      Barriers to Discharge        Equipment Recommendations  Rolling walker with 5" wheels (wheels for standard walker, if not RW)    Recommendations for Other Services     Frequency Min 5X/week    Precautions / Restrictions Precautions Precautions: Back Precaution Booklet Issued: Yes (comment) Required Braces or Orthoses: Spinal Brace   Pertinent Vitals/Pain Pain 6/10 with sitting. RN aware and pain meds given prior      Mobility  Bed Mobility Bed Mobility: Left Sidelying to Sit;Rolling Left;Sitting - Scoot to Edge of Bed;Sit to Supine;Scooting to [facility] Rolling Left: 4: Min assist;With rail Left Sidelying to Sit: 3: Mod assist;HOB flat;With rails Sitting - Scoot to Edge of Bed: 4: Min assist Sit to Supine: 3: Mod assist;HOB flat Scooting to HOB: 1: +2 Total assist Scooting to [facility]: Patient Percentage: 10% Details for Bed Mobility Assistance: VC for safe technique to maintain back precautions into sitting. Upon sitting, pt complained of dizziness that was not subsiding. Sat at EOB for ~5 minutes and symptoms did not resolve. BP 86/53, pt layed back down. Transfers Transfers: Not assessed (BP in sitting)    Shoulder Instructions     Exercises      PT Diagnosis: Difficulty walking;Acute pain  PT Problem List: Decreased strength;Decreased activity tolerance;Decreased mobility;Decreased knowledge of use of DME;Decreased safety awareness;Decreased knowledge of precautions;Pain PT Treatment Interventions: DME instruction;Gait training;Stair training;Functional mobility training;Therapeutic activities;Patient/family education   PT Goals Acute Rehab PT Goals PT Goal Formulation: With patient Time For Goal Achievement: 07/01/12 Potential to Achieve Goals: Good Pt will go Supine/Side to Sit: with modified independence PT Goal: Supine/Side to Sit - Progress: Goal set today Pt will go Sit to Supine/Side: with modified independence PT Goal: Sit to Supine/Side - Progress: Goal set today Pt will go Sit to Stand: with supervision PT Goal: Sit to Stand - Progress: Goal set today Pt will go Stand to Sit: with supervision PT Goal: Stand to Sit - Progress: Goal set today Pt will Transfer Bed to Chair/Chair to Bed: with supervision PT Transfer Goal: Bed to Chair/Chair to Bed - Progress: Goal set today Pt will Ambulate: >150 feet;with supervision;with least restrictive assistive device PT Goal: Ambulate - Progress: Goal set today Pt will Go Up / Down Stairs: 3-5 stairs;with rail(s);with supervision PT Goal: Up/Down Stairs - Progress: Goal set today  Visit Information  Last PT Received On: 06/24/12 Assistance Needed: +1 PT/OT Co-Evaluation/Treatment: Yes    Subjective Data  Subjective: "I don't feel good" Patient Stated Goal: to go home   Prior Functioning  Home Living Lives With: Spouse Available Help at Discharge: Family Type of Home: House Home Access: Stairs to enter Secretary/administrator of Steps: 3 Entrance Stairs-Rails: Can reach both Home Layout: One level Bathroom Shower/Tub: Walk-in  shower;Tub/shower unit;Door [employer] Toilet: Standard Home Adaptive Equipment: Walker - standard;Wheelchair - manual;Shower chair with  back;Hand-held shower hose;Bedside commode/3-in-1;Grab bars in shower Prior Function Level of Independence: Independent Able to Take Stairs?: Yes Driving: Yes Vocation: Full time employment Communication Communication: No difficulties Dominant Hand: Right    Cognition  Overall Cognitive Status: Appears within functional limits for tasks assessed/performed Arousal/Alertness: Awake/alert Orientation Level: Appears intact for tasks assessed Behavior During Session: [facility] for tasks performed    Extremity/Trunk Assessment Right Upper Extremity Assessment RUE ROM/Strength/Tone: WFL for tasks assessed RUE Sensation: WFL - Light Touch;WFL - Proprioception RUE Coordination: WFL - gross/fine motor Left Upper Extremity Assessment LUE ROM/Strength/Tone: WFL for tasks assessed LUE Sensation: WFL - Light Touch;WFL - Proprioception LUE Coordination: WFL - gross/fine motor Right Lower Extremity Assessment RLE ROM/Strength/Tone: Deficits;Due to pain (c/o pain R hip) RLE ROM/Strength/Tone Deficits: grossly 4/5 RLE Sensation: WFL - Light Touch Left Lower Extremity Assessment LLE ROM/Strength/Tone: Deficits LLE ROM/Strength/Tone Deficits: grossly 4/5 LLE Sensation: WFL - Light Touch Trunk Assessment Trunk Assessment: Normal   Balance    End of Session PT - End of Session Equipment Utilized During Treatment: Gait belt Activity Tolerance: Treatment limited secondary to medical complications (Comment) (BP) Patient left: in bed;with call bell/phone within reach;with family/visitor present Nurse Communication: Mobility status;Other (comment) (BP)    Sylvia Palmer 06/24/2012, 1:01 PM  06/24/2012 Sylvia Palmer DPT PAGER: [phone] OFFICE: [id number]

## 2012-06-24 NOTE — Progress Notes (Signed)
Attempted getting patient out but she refused, because she thinks each time she moves or turn she ends up being nauseous, therefore requested to get out of bed tomorrow. She is doing ok. Has voided 3 times during this shift. Will continue to monitor her

## 2012-06-25 NOTE — Progress Notes (Signed)
Patient ID: Sylvia Palmer, female   DOB: 1955-11-09, 56 y.o.   MRN: [id number] Afeb, VSS Doing well. Some groin pain, but increasing activity slowly. She thinks she should be ready for d/c tomorrow.

## 2012-06-25 NOTE — Progress Notes (Signed)
CSW consult for SNF. Per chart review, PT recommending HH with 24/7 supervision and plan is for pt to d/c home with assist provided by spouse and family. CSW signing off as no other CSW needs identified at this time.  Dellie Burns, MSW, LCSWA [phone] (Weekends 8:00am-4:30pm)

## 2012-06-25 NOTE — Progress Notes (Signed)
Physical Therapy Treatment Patient Details Name: Sylvia Palmer MRN: [id number] DOB: 04-08-1956 Today's Date: 06/25/2012 Time: 4098-1191 PT Time Calculation (min): 24 min  PT Assessment / Plan / Recommendation Comments on Treatment Session  Pt progressing well. She was able to tolerate upright positioning today progressing to ambulation. Pt still limited by pain and fatigue. Continue per plan, possibly attempt stairs tomorrow prior to d/c home    Follow Up Recommendations  Home health PT;Supervision/Assistance - 24 hour     Does the patient have the potential to tolerate intense rehabilitation     Barriers to Discharge        Equipment Recommendations  Rolling walker with 5" wheels;3 in 1 bedside comode (wheels for standard walker, if not RW)    Recommendations for Other Services    Frequency Min 5X/week   Plan Discharge plan remains appropriate;Frequency remains appropriate    Precautions / Restrictions Precautions Precautions: Back Precaution Comments: pt able to recall 3/3 back precautions. Minimal cueing for safety throughout Required Braces or Orthoses: Spinal Brace Spinal Brace: Lumbar corset;Applied in sitting position Restrictions Weight Bearing Restrictions: No   Pertinent Vitals/Pain Pain in L side(5/10) with ambulation and descent into chair.     Mobility  Bed Mobility Bed Mobility: Rolling Right;Right Sidelying to Sit;Sitting - Scoot to Edge of Bed Rolling Right: 4: Min assist;With rail Right Sidelying to Sit: 4: Min assist;With rails;HOB flat Sitting - Scoot to Edge of Bed: 4: Min assist Details for Bed Mobility Assistance: Min assist through trunk into sitting. CUes throughout for proper technique and safety during transfer. Increased time to complete scoot towards EOB. Transfers Transfers: Sit to Stand;Stand to Sit Sit to Stand: 4: Min assist;With upper extremity assist;From bed;From toilet Stand to Sit: 4: Min assist;With upper extremity assist;To  chair/3-in-1;To toilet Details for Transfer Assistance: Cues for safe hand placement. Increased assistance for anterior translation while maintaining back precautions. pt able to control descent Ambulation/Gait Ambulation/Gait Assistance: 4: Min assist Ambulation Distance (Feet): 75 Feet Assistive device: Rolling walker Ambulation/Gait Assistance Details: VC for safe technique with RW and for upright posture. Pt with slow movements, complained of minimal pain in L side during ambulation. Gait Pattern: Step-to pattern;Decreased stride length;Decreased hip/knee flexion - left;Decreased hip/knee flexion - right;Trunk flexed;Narrow base of support;Antalgic Gait velocity: slow gait speed    Exercises     PT Diagnosis:    PT Problem List:   PT Treatment Interventions:     PT Goals Acute Rehab PT Goals PT Goal: Supine/Side to Sit - Progress: Progressing toward goal PT Goal: Sit to Supine/Side - Progress: Progressing toward goal PT Goal: Sit to Stand - Progress: Progressing toward goal PT Goal: Stand to Sit - Progress: Progressing toward goal PT Transfer Goal: Bed to Chair/Chair to Bed - Progress: Progressing toward goal PT Goal: Ambulate - Progress: Progressing toward goal  Visit Information  Last PT Received On: 06/25/12 Assistance Needed: +1    Subjective Data      Cognition  Overall Cognitive Status: Appears within functional limits for tasks assessed/performed Arousal/Alertness: Awake/alert Orientation Level: Appears intact for tasks assessed Behavior During Session: [facility] for tasks performed    Balance     End of Session PT - End of Session Equipment Utilized During Treatment: Gait belt;Back brace Activity Tolerance: Patient tolerated treatment well Patient left: in chair;with call bell/phone within reach;with family/visitor present Nurse Communication: Mobility status   GP     [name] 06/25/2012, 8:48 AM

## 2012-06-25 NOTE — Progress Notes (Signed)
Occupational Therapy Treatment Patient Details Name: Sylvia Palmer MRN: [id number] DOB: 19-Aug-1955 Today's Date: 06/25/2012 Time: 4098-1191 OT Time Calculation (min): 27 min  OT Assessment / Plan / Recommendation Comments on Treatment Session Making excellent progress. will be able to D/C home Monday or Tuesday am.  All further needs to be addressed by [facility]    Follow Up Recommendations  Home health OT    Barriers to Discharge       Equipment Recommendations  Rolling walker with 5" wheels;3 in 1 bedside comode    Recommendations for Other Services    Frequency Min 2X/week   Plan Discharge plan remains appropriate    Precautions / Restrictions Precautions Precautions: Back Precaution Comments: pt able to recall 3/3 back precautions. Minimal cueing for safety throughout Required Braces or Orthoses: Spinal Brace Spinal Brace: Lumbar corset;Applied in sitting position   Pertinent Vitals/Pain 3/10    ADL  Lower Body Bathing: Other (comment) (educated pt on use of long handled sponge and compensatory ) Where Assessed - Lower Body Bathing: Unsupported sit to stand Lower Body Dressing: Minimal assistance;Other (comment) (with use of AE) Equipment Used: Back brace;Gait belt;Reacher;Rolling walker;Sock aid;Long-handled shoe horn;Long-handled sponge Transfers/Ambulation Related to ADLs: supervision ADL Comments: Ecuated on availability of AE and compensatory techniques for ADL. Pt/family verbalized and demonstrted understanding    OT Diagnosis:    OT Problem List:   OT Treatment Interventions:     OT Goals Acute Rehab OT Goals OT Goal Formulation: With patient Time For Goal Achievement: 07/01/12 Potential to Achieve Goals: Good ADL Goals Pt Will Perform Grooming: with modified independence;Unsupported;Standing at sink ADL Goal: Grooming - Progress: Progressing toward goals Pt Will Perform Lower Body Bathing: with supervision;with caregiver independent in assisting;Sit to stand  from chair;Unsupported;with adaptive equipment ADL Goal: Lower Body Bathing - Progress: Progressing toward goals Pt Will Perform Lower Body Dressing: with supervision;with caregiver independent in assisting;Sit to stand from chair;Unsupported;with adaptive equipment ADL Goal: Lower Body Dressing - Progress: Progressing toward goals Pt Will Transfer to Toilet: Ambulation;3-in-1;with modified independence;Maintaining back safety precautions ADL Goal: Toilet Transfer - Progress: Progressing toward goals Pt Will Perform Toileting - Clothing Manipulation: with supervision;with adaptive equipment;Sitting on 3-in-1 or toilet;Standing ADL Goal: Toileting - Clothing Manipulation - Progress: Progressing toward goals Pt Will Perform Toileting - Hygiene: with supervision;Sit to stand from 3-in-1/toilet;with adaptive equipment ADL Goal: Toileting - Hygiene - Progress: Progressing toward goals Additional ADL Goal #1: Pt will verbalize 3/3 back precautions independently. ADL Goal: Additional Goal #1 - Progress: Met  Visit Information  Last OT Received On: 06/25/12 Assistance Needed: +1    Subjective Data      Prior Functioning       Cognition  Overall Cognitive Status: Appears within functional limits for tasks assessed/performed    Mobility  Shoulder Instructions Bed Mobility Bed Mobility: Supine to Sit;Sit to Supine;Rolling Right;Right Sidelying to Sit;Sit to Sidelying Right Rolling Right: 6: Modified independent (Device/Increase time) Right Sidelying to Sit: 5: Supervision Supine to Sit: 5: Supervision Sitting - Scoot to Edge of Bed: 6: Modified independent (Device/Increase time) Sit to Supine: 5: Supervision;HOB flat Sit to Sidelying Right: 5: Supervision;HOB flat Details for Bed Mobility Assistance: demonstrate back precautions during mobility Transfers Transfers: Sit to Stand;Stand to Sit Sit to Stand: 5: Supervision;With upper extremity assist;From bed Stand to Sit: 5:  Supervision;With upper extremity assist;To bed Details for Transfer Assistance: good hand placement       Exercises      Balance  WFL   End  of Session OT - End of Session Equipment Utilized During Treatment: Gait belt;Back brace Activity Tolerance: Patient tolerated treatment well Patient left: in bed;with call bell/phone within reach;with family/visitor present Nurse Communication: Mobility status  GO     Millena Callins,HILLARY 06/25/2012, 2:00 PM [facility], OTR/L  [phone] 06/25/2012

## 2012-06-25 NOTE — Progress Notes (Signed)
Physical Therapy Treatment Patient Details Name: Sylvia Palmer MRN: [id number] DOB: May 09, 1956 Today's Date: 06/25/2012 Time: 4696-2952 PT Time Calculation (min): 11 min  PT Assessment / Plan / Recommendation Comments on Treatment Session  Assisted pt on/off toilet. Pt still requiring cueing for safety during sit/stand. MD present during session and discussed L groin pain with pt.     Follow Up Recommendations  Home health PT;Supervision/Assistance - 24 hour     Does the patient have the potential to tolerate intense rehabilitation     Barriers to Discharge        Equipment Recommendations  Rolling walker with 5" wheels;3 in 1 bedside comode    Recommendations for Other Services    Frequency Min 5X/week   Plan Discharge plan remains appropriate;Frequency remains appropriate    Precautions / Restrictions Precautions Precautions: Back Precaution Comments: pt able to recall 3/3 back precautions. Minimal cueing for safety throughout Required Braces or Orthoses: Spinal Brace Spinal Brace: Lumbar corset;Applied in sitting position Restrictions Weight Bearing Restrictions: No   Pertinent Vitals/Pain Pain in L groin with ambulation, MD aware.     Mobility  Bed Mobility Bed Mobility: Not assessed Transfers: Sit to Stand;Stand to Sit Sit to Stand: 4: Min assist;With upper extremity assist;From chair/3-in-1;From toilet Stand to Sit: 4: Min assist;With upper extremity assist;To chair/3-in-1;To toilet Details for Transfer Assistance: Cues for hand placement. Pt requires increased assistance and cueing for technique to scoot towards edge of chair and toilet for weight shifting. Assist for stability into standing Ambulation/Gait Ambulation/Gait Assistance: 4: Min assist Ambulation Distance (Feet): 15 Feet Assistive device: Rolling walker Ambulation/Gait Assistance Details: VC for upright posture and for decreased step length as pt with increased pain with large steps Gait Pattern:  Step-to pattern;Decreased stride length;Decreased hip/knee flexion - left;Decreased hip/knee flexion - right;Trunk flexed;Narrow base of support;Antalgic Gait velocity: slow gait speed      PT Goals Acute Rehab PT Goals PT Goal: Supine/Side to Sit - Progress: Progressing toward goal PT Goal: Sit to Supine/Side - Progress: Progressing toward goal PT Goal: Sit to Stand - Progress: Progressing toward goal PT Goal: Stand to Sit - Progress: Progressing toward goal PT Transfer Goal: Bed to Chair/Chair to Bed - Progress: Progressing toward goal PT Goal: Ambulate - Progress: Progressing toward goal  Visit Information  Last PT Received On: 06/25/12 Assistance Needed: +1    Subjective Data      Cognition  Overall Cognitive Status: Appears within functional limits for tasks assessed/performed Arousal/Alertness: Awake/alert Orientation Level: Appears intact for tasks assessed Behavior During Session: [facility] for tasks performed    Balance     End of Session PT - End of Session Equipment Utilized During Treatment: Gait belt;Back brace Activity Tolerance: Patient tolerated treatment well Patient left: in bed;with call bell/phone within reach;with nursing in room;with family/visitor present (sitting at EOB with nurst tech) Nurse Communication: Mobility status   GP     [name] 06/25/2012, 9:37 AM

## 2012-06-26 MED ORDER — FLEET ENEMA 7-19 GM/118ML RE ENEM: 1.0000 | ENEMA | Freq: Every day | RECTAL | Status: DC | PRN

## 2012-06-26 MED FILL — Sodium Chloride IV Soln 0.9%: INTRAVENOUS | Qty: 1000 | Status: AC

## 2012-06-26 MED FILL — Sodium Chloride Irrigation Soln 0.9%: Qty: 3000 | Status: AC

## 2012-06-26 MED FILL — Heparin Sodium (Porcine) Inj 1000 Unit/ML: INTRAMUSCULAR | Qty: 30 | Status: AC

## 2012-06-26 NOTE — Progress Notes (Signed)
Patient ID: Sylvia Palmer, female   DOB: [birth year], 56 y.o.   MRN: [id number] Doing well. No weakness. Wound dry. Home in am

## 2012-06-26 NOTE — Progress Notes (Signed)
Physical Therapy Treatment Patient Details Name: Sylvia Palmer MRN: [id number] DOB: May 20, 1956 Today's Date: 06/26/2012 Time: 5784-6962 PT Time Calculation (min): 25 min  PT Assessment / Plan / Recommendation Comments on Treatment Session  Pt. stated that she was feeling a lot better than she had in previous sessions, Pt. up and OOB with CNA upon arrival to room. Pt. ambulated in hallway with RW and executed the stairs in the small PT gym on [address]. Pt. cued to walk a little faster and denied any twinge she has been experiencing in her Lt. groin. Pt. presents to be moving well. Pt. positioned in in bed in Rt S/L  stating that she had been up for quite a while this morning.     Follow Up Recommendations  Home health PT;Supervision/Assistance - 24 hour     Does the patient have the potential to tolerate intense rehabilitation     Barriers to Discharge        Equipment Recommendations  Rolling walker with 5" wheels;3 in 1 bedside comode    Recommendations for Other Services    Frequency Min 5X/week   Plan Discharge plan remains appropriate;Frequency remains appropriate    Precautions / Restrictions Precautions Precautions: Back Precaution Comments: Pt. able to recall 3/3 back precautions Required Braces or Orthoses: Spinal Brace Spinal Brace: Lumbar corset;Applied in sitting position Restrictions Weight Bearing Restrictions: No   Pertinent Vitals/Pain Patient reported pain in her back around surgical area 4-5/10 and denies needing pain medication stating that she has not had any in ~2 days.     Mobility  Bed Mobility Bed Mobility: Sit to Sidelying Right;Rolling Right Rolling Right: 6: Modified independent (Device/Increase time) Sit to Sidelying Right: 6: Modified independent (Device/Increase time);HOB flat Details for Bed Mobility Assistance: Pt. demonstrated bed mobility while following back precautions getting back into bed post ambulation. Transfers Transfers: Stand to  Sit Stand to Sit: 6: Modified independent (Device/Increase time);With upper extremity assist;To bed Details for Transfer Assistance: Pt. demonstrated proper hand placement throughout transfer and stated the proper way to sit. Some increased time for sitting and following back precautions. Ambulation/Gait Ambulation/Gait Assistance: 4: Min guard Ambulation Distance (Feet): 500 Feet (500+) Assistive device: Rolling walker Ambulation/Gait Assistance Details: VC given for trying to increase speed with ambulation. Pt. stated that she did not feel the twinge in the Lt. groin area when she ambulated slightly faster. Gait Pattern: Step-through pattern;Decreased stride length Gait velocity: Slow, pt. sped up with cues Stairs: Yes Stairs Assistance: 4: Min guard Stairs Assistance Details (indicate cue type and reason): pt. min guard for safety and given VC for going up with good and down with bad. Suggested going up with right since she feels the twinge in the Lt. groin. Pt. stated this technique worked well for her and asked if she could go up/down once more. Stair Management Technique: Two rails;Forwards Number of Stairs: 3  (3 stairs, twice) Wheelchair Mobility Wheelchair Mobility: No      PT Goals Acute Rehab PT Goals PT Goal Formulation: With patient Time For Goal Achievement: 07/01/12 Potential to Achieve Goals: Good Pt will go Supine/Side to Sit: with modified independence Pt will go Sit to Supine/Side: with modified independence PT Goal: Sit to Supine/Side - Progress: Met Pt will go Sit to Stand: with supervision Pt will go Stand to Sit: with supervision PT Goal: Stand to Sit - Progress: Progressing toward goal Pt will Transfer Bed to Chair/Chair to Bed: with supervision Pt will Ambulate: >150 feet;with supervision;with least restrictive assistive  device PT Goal: Ambulate - Progress: Progressing toward goal Pt will Go Up / Down Stairs: 3-5 stairs;with rail(s);with supervision PT  Goal: Up/Down Stairs - Progress: Progressing toward goal  Visit Information  Last PT Received On: 06/26/12 Assistance Needed: +1    Subjective Data  Subjective: "I'm doing good!" Patient Stated Goal: to go home   Cognition  Overall Cognitive Status: Appears within functional limits for tasks assessed/performed Arousal/Alertness: Awake/alert Orientation Level: Appears intact for tasks assessed Behavior During Session: [facility] for tasks performed    Balance     End of Session PT - End of Session Equipment Utilized During Treatment: Gait belt;Back brace Activity Tolerance: Patient tolerated treatment well Patient left: in bed;with call bell/phone within reach;with family/visitor present (her husband) Nurse Communication: Mobility status     Mertie Clause, SPTA 06/26/2012, 12:06 PM

## 2012-06-26 NOTE — Progress Notes (Signed)
Rashi Granier, PTA [phone] 06/26/2012  

## 2012-06-26 NOTE — Progress Notes (Signed)
Patient refused enema today though LBM is 06/23/12

## 2012-06-27 NOTE — Discharge Summary (Signed)
Physician Discharge Summary  Patient ID: CANDE MASTROPIETRO MRN: [id number] DOB/AGE: 09-07-1955 56 y.o.  Admit date: 06/23/2012 Discharge date: 06/27/2012  Admission Diagnoses:ddd lumbar spine  Discharge Diagnoses: same   Discharged Condition: no pain  Hospital Course:surgery  Consults: none  Significant Diagnostic Studies: myelogram  Treatments: lumbar fusion  Discharge Exam: Blood pressure 102/55, pulse 72, temperature 98.5 F (36.9 C), temperature source Oral, resp. rate 18, height 5\' 7"  (1.702 m), weight 93.895 kg (207 lb), SpO2 98.00%. Walking, no pain or weakness  Disposition:  Home on oxycodone and diazepaz    Medication List     As of 06/27/2012 12:34 PM    ASK your doctor about these medications         aspirin EC 81 MG tablet   Take 81 mg by mouth daily.      CORICIDIN HBP COLD/FLU PO   Take 1 tablet by mouth daily as needed. Cold symptoms      diazepam 5 MG tablet   Commonly known as: VALIUM   Take 5 mg by mouth at bedtime as needed. spasm      diazepam 10 MG tablet   Commonly known as: VALIUM   Take 10 mg by mouth at bedtime as needed. spasm      oxyCODONE-acetaminophen 7.5-325 MG per tablet   Commonly known as: PERCOCET   Take 0.5-1 tablets by mouth 2 (two) times daily as needed. pain      valsartan-hydrochlorothiazide 160-12.5 MG per tablet   Commonly known as: DIOVAN-HCT   Take 1 tablet by mouth daily.         Signed: Karn Cassis 06/27/2012, 12:34 PM  ddd lumbar spine

## 2012-06-27 NOTE — Progress Notes (Signed)
Physical Therapy Treatment Patient Details Name: Sylvia Palmer MRN: [id number] DOB: 11/01/1955 Today's Date: 06/27/2012 Time: 4098-1191 PT Time Calculation (min): 18 min  PT Assessment / Plan / Recommendation Comments on Treatment Session  Pt. stated that she was ready to get home and feels better with each day from surgery, Pt. able to ambulate without AD in hallway with SPTA (educated to stay with RW when not with PT) and presents to be moving well, demonstrating progression with her goals. Pt. states that twinge in the Lt. groin is decreasing with each day since surgery.    Follow Up Recommendations  Home health PT;Supervision/Assistance - 24 hour     Does the patient have the potential to tolerate intense rehabilitation     Barriers to Discharge        Equipment Recommendations  Rolling walker with 5" wheels;3 in 1 bedside comode    Recommendations for Other Services    Frequency Min 5X/week   Plan Discharge plan remains appropriate;Frequency remains appropriate    Precautions / Restrictions Precautions Precautions: Back Precaution Comments: Pt. able to recall 3/3 back precautions Required Braces or Orthoses: Spinal Brace Spinal Brace: Lumbar corset;Applied in sitting position Restrictions Weight Bearing Restrictions: No   Pertinent Vitals/Pain Patient reports pain in the Lt. Groin area as 4/10 only at times and the occurrence is decreasing each day post surgery.    Mobility  Bed Mobility Bed Mobility: Not assessed Transfers Transfers: Sit to Stand;Stand to Sit Sit to Stand: 6: Modified independent (Device/Increase time);With upper extremity assist;With armrests;From chair/3-in-1 Stand to Sit: 6: Modified independent (Device/Increase time);With upper extremity assist;With armrests;To chair/3-in-1 Details for Transfer Assistance: Pt. demonstrated proper hand placement throughout transfers to/from RW. Pt. with some increased time with transfer to maintain back precautions  and to oversome the twinging she feels in Lt. groin. Pt. states twinge is getting better with each day wince surgery. Ambulation/Gait Ambulation/Gait Assistance: 4: Min guard;5: Supervision Ambulation Distance (Feet): 500 Feet (500+ (entire [address] unit)) Assistive device: Rolling walker;None Ambulation/Gait Assistance Details: Pt. ambulated with supervision for safety with RW around ~25% of the [address] unit. Pt. min guard with ambulation with NO AD for safety and steadiness for remaining 75% of unit. Pt. slightly unsteady during ambulation with no AD that decreased as she continued. Pt. educated to still use RW without PT and that HHPT would help her progress once she is no longer in the hospital. Gait Pattern: Step-through pattern;Decreased stride length Gait velocity: Slow, pt. sped up with cues Stairs: Yes Stairs Assistance: 5: Supervision Stairs Assistance Details (indicate cue type and reason): Pt. supervision for stairs for safety and VC for technique. Pt. stated that it feels good on her legs to be up and doing the stairs.  Stair Management Technique: Two rails;Forwards Number of Stairs: 3  Wheelchair Mobility Wheelchair Mobility: No      PT Goals Acute Rehab PT Goals PT Goal Formulation: With patient Time For Goal Achievement: 07/01/12 Potential to Achieve Goals: Good Pt will go Supine/Side to Sit: with modified independence Pt will go Sit to Supine/Side: with modified independence Pt will go Sit to Stand: with supervision PT Goal: Sit to Stand - Progress: Met Pt will go Stand to Sit: with supervision PT Goal: Stand to Sit - Progress: Met Pt will Transfer Bed to Chair/Chair to Bed: with supervision Pt will Ambulate: >150 feet;with supervision;with least restrictive assistive device PT Goal: Ambulate - Progress: Progressing toward goal Pt will Go Up / Down Stairs: 3-5 stairs;with rail(s);with supervision  PT Goal: Up/Down Stairs - Progress: Progressing toward goal  Visit  Information  Last PT Received On: 06/27/12 Assistance Needed: +1    Subjective Data  Subjective: "I am so ready to get home!" Patient Stated Goal: to go home   Cognition  Overall Cognitive Status: Appears within functional limits for tasks assessed/performed Arousal/Alertness: Awake/alert Orientation Level: Appears intact for tasks assessed Behavior During Session: [facility] for tasks performed    Balance  Balance Balance Assessed: Yes Static Standing Balance Static Standing - Balance Support: No upper extremity supported Static Standing - Level of Assistance: 5: Stand by assistance Static Standing - Comment/# of Minutes: Pt stood with SBA with no UE support in hallway and only slight increase in postural sway, for ~2 mins. Pt. maintained conversation and head turns with static standing.  End of Session PT - End of Session Equipment Utilized During Treatment: Gait belt;Back brace Activity Tolerance: Patient tolerated treatment well Patient left: in chair;with call bell/phone within reach Nurse Communication: Mobility status   Mertie Clause, SPTA 06/27/2012, 11:51 AM

## 2012-06-27 NOTE — Progress Notes (Signed)
Ebbie Sorenson, PTA [phone] 06/27/2012  

## 2012-06-27 NOTE — Care Management Note (Signed)
    Page 1 of 2   06/27/2012     2:03:53 PM   CARE MANAGEMENT NOTE 06/27/2012  Patient:  Sylvia Palmer, Sylvia Palmer   Account Number:  [id number]  Date Initiated:  06/23/2012  Documentation initiated by:  [facility]  Subjective/Objective Assessment:   Admitted postop PLIF L1-2, 2-3.     Action/Plan:   PT/OT-recommending HHPT and HHOT, rolling walker, 3N1   Anticipated DC Date:  06/27/2012   Anticipated DC Plan:  HOME W HOME HEALTH SERVICES      DC Planning Services  CM consult      Choice offered to / List presented to:  C-1 Patient   DME arranged  3-N-1  Levan Hurst      DME agency  [facility]     HH arranged  HH-2 PT  HH-3 OT      [facility] agency  [facility]   Status of service:  Completed, signed off Medicare Important Message given?   (If response is "NO", the following Medicare IM given date fields will be blank) Date Medicare IM given:   Date Additional Medicare IM given:    Discharge Disposition:  HOME W HOME HEALTH SERVICES  Per UR Regulation:  Reviewed for med. necessity/level of care/duration of stay  If discussed at Long Length of Stay Meetings, dates discussed:    Comments:  06/27/12 Spoke with patiernt and her spouse about HHC for HHPT and HHOT. She chose [facility]. Contacted [facility], they only work with [employer] for joint patients. Contacted patient's second choice [facility], requested HHPT and OT, they will be able to service the patient. Rolling walker and 3N1 have been delivered to the patient's room. Anticipate d/c today. Deatra Canter RN, BSN, CCM

## 2013-02-27 ENCOUNTER — Other Ambulatory Visit: Payer: Self-pay | Admitting: Orthopaedic Surgery

## 2013-02-27 DIAGNOSIS — M549 Dorsalgia, unspecified: Secondary | ICD-10-CM

## 2013-03-09 ENCOUNTER — Ambulatory Visit
Admission: RE | Admit: 2013-03-09 | Discharge: 2013-03-09 | Disposition: A | Discharge status: Home / Self Care | Payer: BC Managed Care – PPO | Source: Ambulatory Visit | Attending: Orthopaedic Surgery | Admitting: Orthopaedic Surgery

## 2013-03-09 VITALS — BP 113/58 | HR 55

## 2013-03-09 DIAGNOSIS — M549 Dorsalgia, unspecified: Secondary | ICD-10-CM

## 2013-03-09 MED ORDER — DIAZEPAM 5 MG PO TABS
10.0000 mg | ORAL_TABLET | Freq: Once | ORAL | Status: AC
  Administered 2013-03-09: 10 mg via ORAL

## 2013-03-09 MED ORDER — IOHEXOL 300 MG/ML  SOLN
10.0000 mL | Freq: Once | INTRAMUSCULAR | Status: AC | PRN
  Administered 2013-03-09: 10 mL via INTRAVENOUS

## 2013-03-09 NOTE — Progress Notes (Signed)
Discharge instructions explained to pt and her Mom.

## 2013-07-13 IMAGING — CR DG LUMBAR SPINE 2-3V
1 series · 1 of 1 positions shown · non-contrast
Comparison: 01/11/2012

CLINICAL DATA: L1-2 and L2-3 PLIF

LUMBAR SPINE - 2-3 VIEW

[view not recorded]
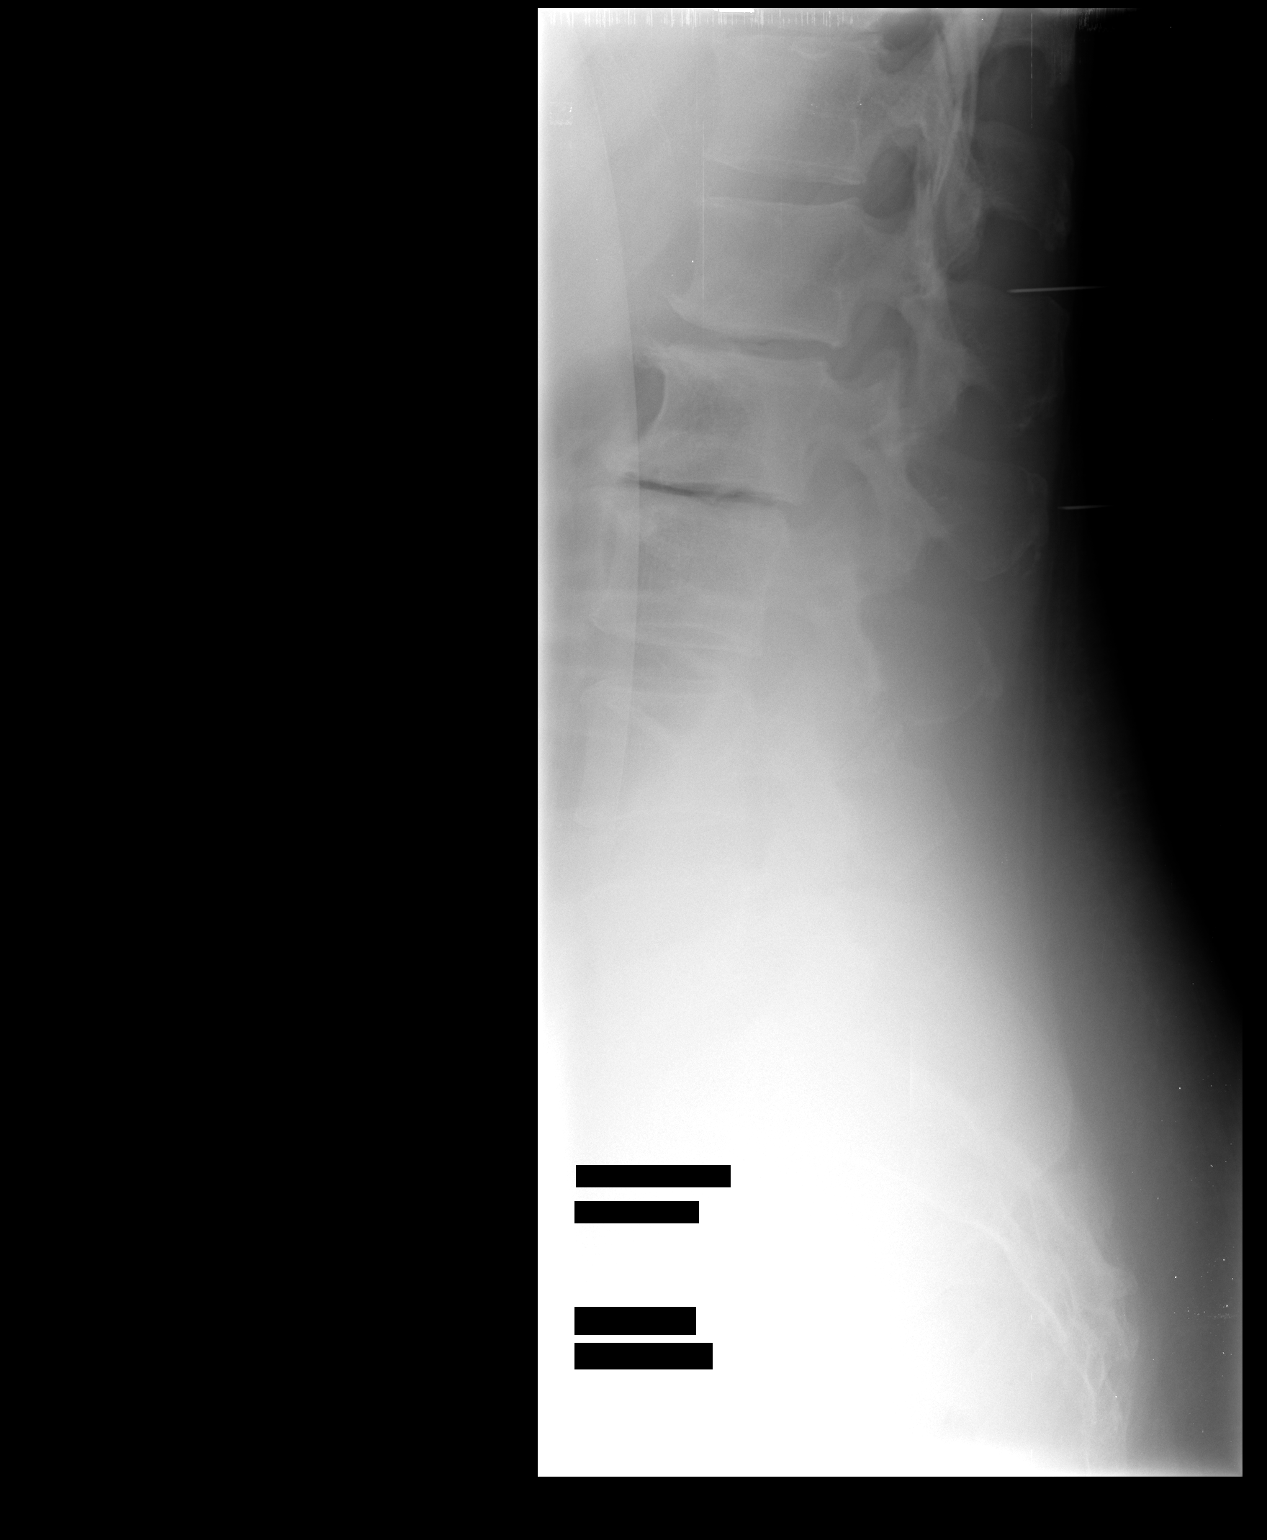

[1 of 1 positions shown; findings below may reference images not displayed]

FINDINGS: Initial film shows a needle at the upper spinous process
of L1.  Second film shows clamps on the spinous processes of T12,
L1 and L2.
IMPRESSION: T12, L1 and L2 spinous processes localized.

## 2013-07-13 IMAGING — RF DG C-ARM 61-120 MIN
1 series · 4 of 4 positions shown · non-contrast
Comparison: Same day

CLINICAL DATA: PLIF L1-L3

LUMBAR SPINE - COMPLETE 4+ VIEW

[Series 1: run · 4 of 4 slices shown]
[im 1/4]
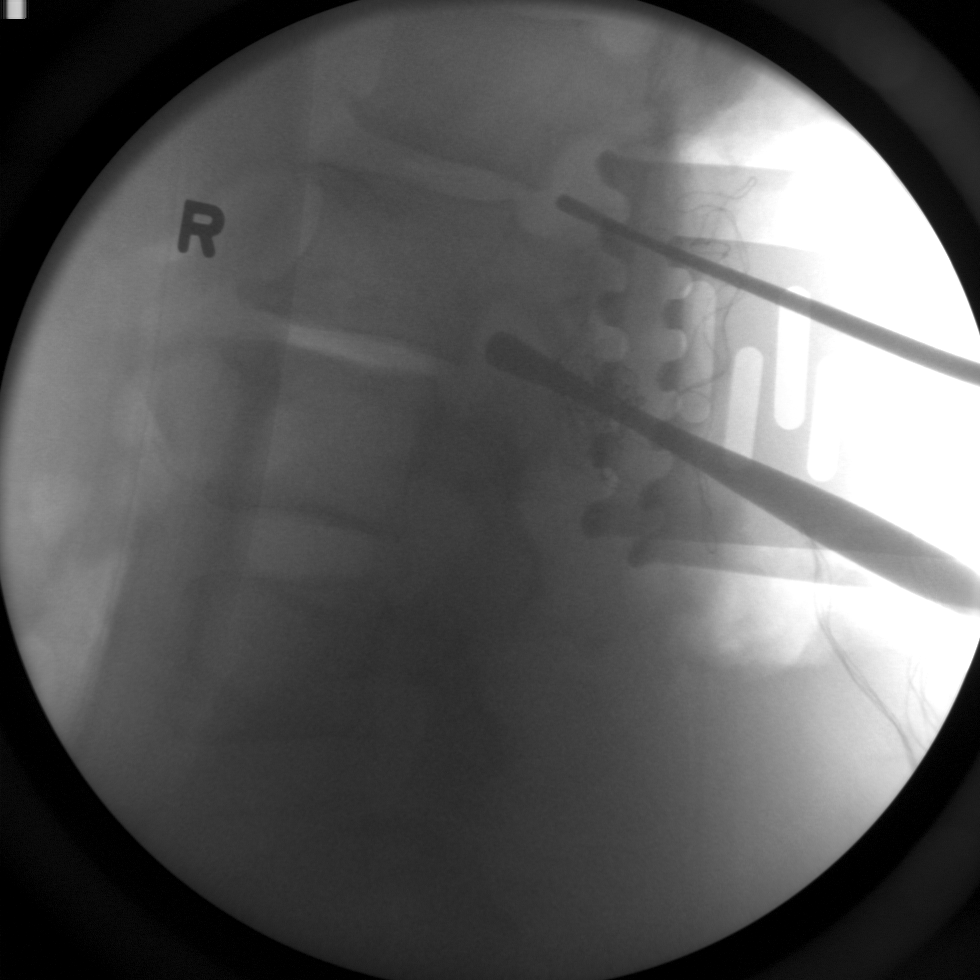
[im 2/4]
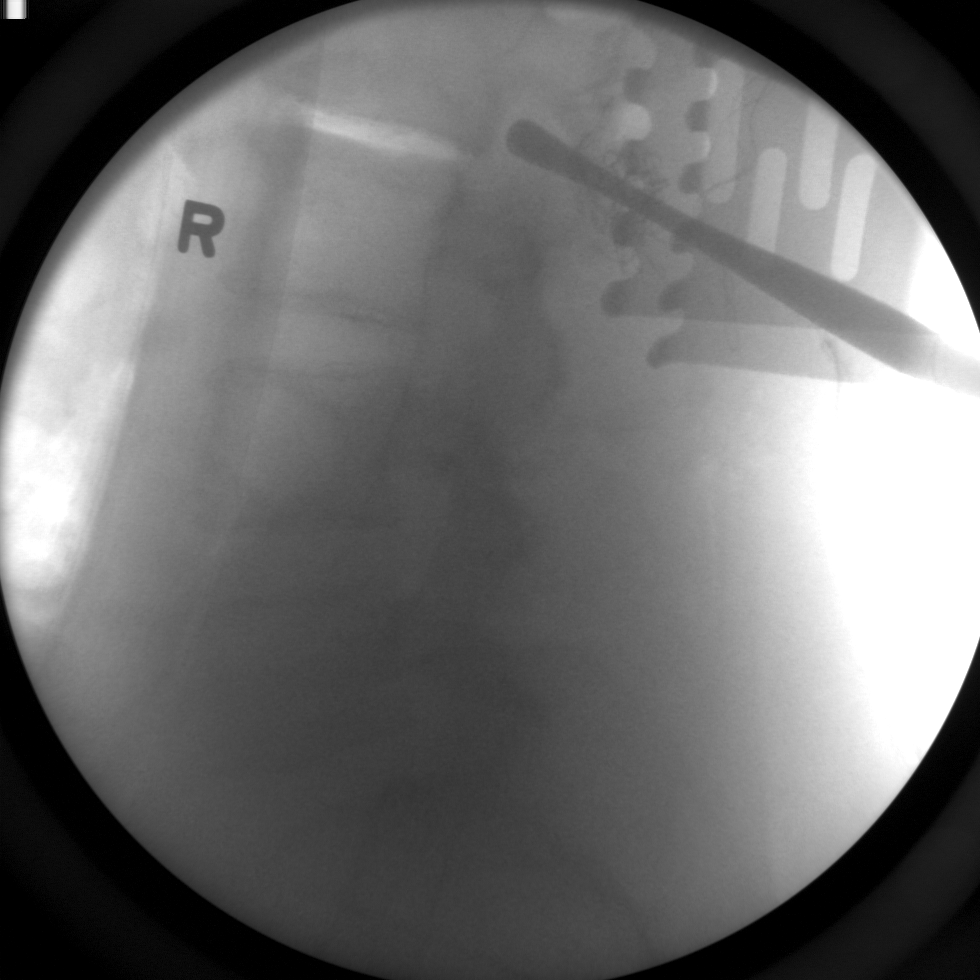
[im 3/4]
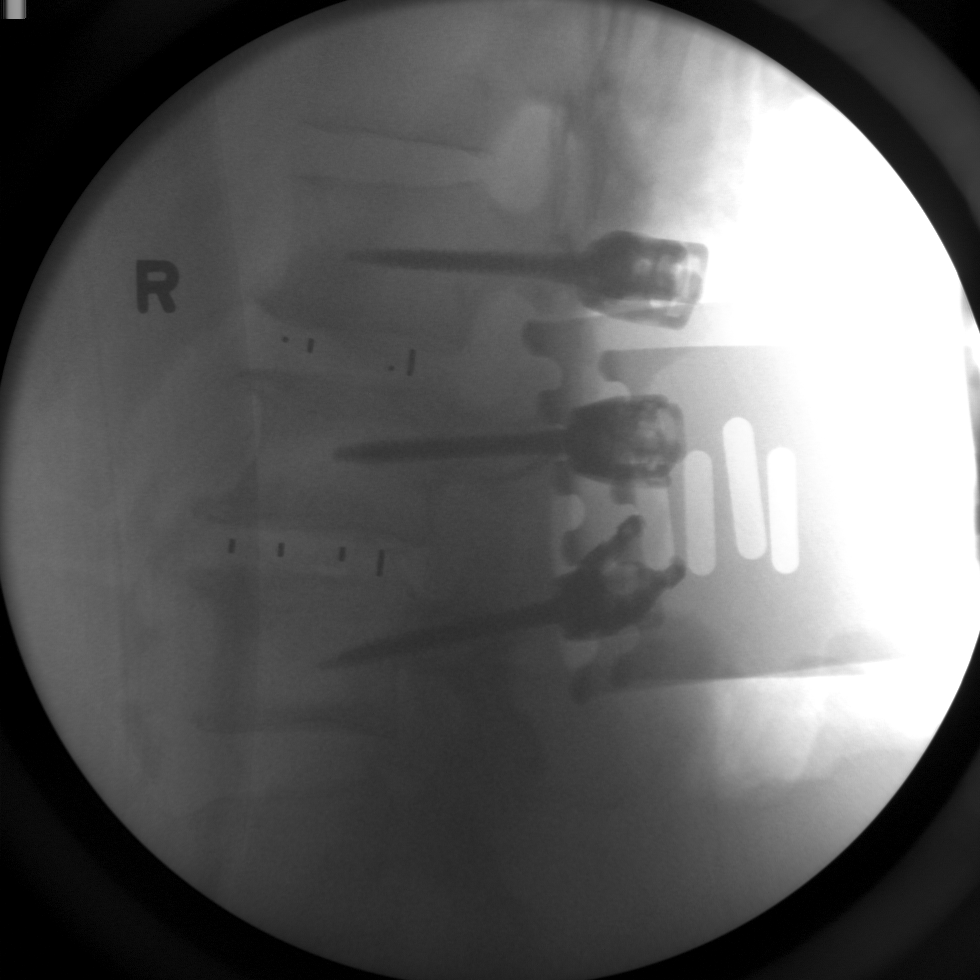
[im 4/4]
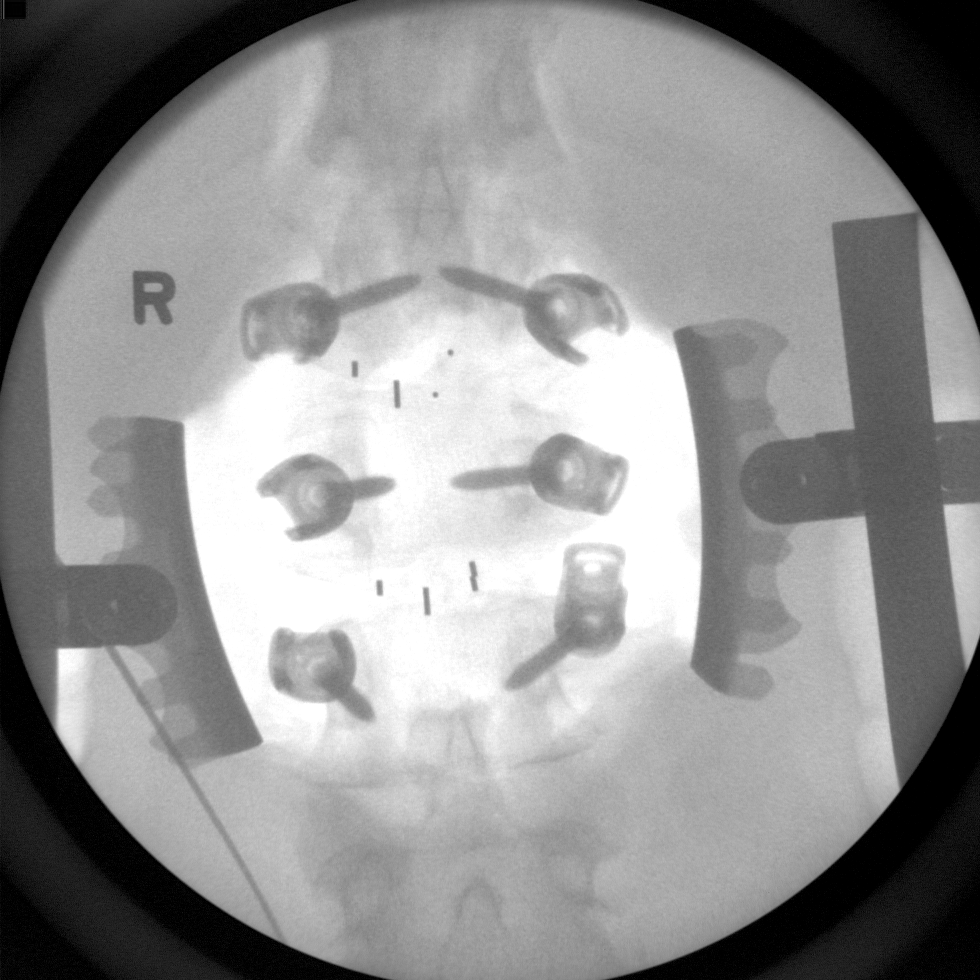

[4 of 4 positions shown; findings below may reference images not displayed]

FINDINGS: Headache multiple C-arm images submitted.  Initial film
shows a probe posterior to the L2-3 disc level.  Second image shows
probes posterior to the L1-2 and L2-3 disc levels.  Third film
shows discectomy at those levels with interbody fusion material and
placement of bilateral pedicle screws.  Fourth film shows a lateral
projection of those same findings.
IMPRESSION: PLIF in progress L1-L3.

## 2014-05-13 ENCOUNTER — Other Ambulatory Visit: Payer: Self-pay | Admitting: Orthopaedic Surgery

## 2014-05-13 DIAGNOSIS — M961 Postlaminectomy syndrome, not elsewhere classified: Secondary | ICD-10-CM

## 2014-05-13 DIAGNOSIS — M5030 Other cervical disc degeneration, unspecified cervical region: Secondary | ICD-10-CM

## 2014-05-21 ENCOUNTER — Ambulatory Visit
Admission: RE | Admit: 2014-05-21 | Discharge: 2014-05-21 | Disposition: A | Discharge status: Home / Self Care | Payer: BC Managed Care – PPO | Source: Ambulatory Visit | Attending: Orthopaedic Surgery | Admitting: Orthopaedic Surgery

## 2014-05-21 VITALS — BP 111/51 | HR 55

## 2014-05-21 DIAGNOSIS — M961 Postlaminectomy syndrome, not elsewhere classified: Secondary | ICD-10-CM

## 2014-05-21 DIAGNOSIS — M5030 Other cervical disc degeneration, unspecified cervical region: Secondary | ICD-10-CM

## 2014-05-21 MED ORDER — IOHEXOL 300 MG/ML  SOLN
9.0000 mL | Freq: Once | INTRAMUSCULAR | Status: AC | PRN
  Administered 2014-05-21: 9 mL via INTRATHECAL

## 2014-05-21 MED ORDER — DIAZEPAM 5 MG PO TABS
10.0000 mg | ORAL_TABLET | Freq: Once | ORAL | Status: AC
  Administered 2014-05-21: 10 mg via ORAL

## 2014-05-21 NOTE — Discharge Instructions (Signed)

## 2014-11-08 ENCOUNTER — Inpatient Hospital Stay (HOSPITAL_COMMUNITY)
Admission: AD | Admit: 2014-11-08 | Discharge: 2014-11-10 | DRG: 312 | Disposition: A | Discharge status: Home / Self Care | Payer: Medicaid Other | Source: Other Acute Inpatient Hospital | Attending: Cardiovascular Disease | Admitting: Cardiovascular Disease

## 2014-11-08 DIAGNOSIS — Z91040 Latex allergy status: Secondary | ICD-10-CM | POA: Diagnosis not present

## 2014-11-08 DIAGNOSIS — Z8774 Personal history of (corrected) congenital malformations of heart and circulatory system: Secondary | ICD-10-CM | POA: Diagnosis not present

## 2014-11-08 DIAGNOSIS — M1990 Unspecified osteoarthritis, unspecified site: Secondary | ICD-10-CM | POA: Diagnosis present

## 2014-11-08 DIAGNOSIS — G450 Vertebro-basilar artery syndrome: Secondary | ICD-10-CM | POA: Diagnosis present

## 2014-11-08 DIAGNOSIS — I10 Essential (primary) hypertension: Secondary | ICD-10-CM | POA: Diagnosis present

## 2014-11-08 DIAGNOSIS — Z90710 Acquired absence of both cervix and uterus: Secondary | ICD-10-CM | POA: Diagnosis not present

## 2014-11-08 DIAGNOSIS — Z7982 Long term (current) use of aspirin: Secondary | ICD-10-CM

## 2014-11-08 DIAGNOSIS — M5136 Other intervertebral disc degeneration, lumbar region: Secondary | ICD-10-CM | POA: Diagnosis present

## 2014-11-08 DIAGNOSIS — R55 Syncope and collapse: Secondary | ICD-10-CM | POA: Diagnosis present

## 2014-11-08 DIAGNOSIS — Z8673 Personal history of transient ischemic attack (TIA), and cerebral infarction without residual deficits: Secondary | ICD-10-CM | POA: Diagnosis not present

## 2014-11-08 DIAGNOSIS — Z885 Allergy status to narcotic agent status: Secondary | ICD-10-CM

## 2014-11-08 DIAGNOSIS — Z888 Allergy status to other drugs, medicaments and biological substances status: Secondary | ICD-10-CM

## 2014-11-08 DIAGNOSIS — E860 Dehydration: Secondary | ICD-10-CM | POA: Diagnosis present

## 2014-11-08 DIAGNOSIS — I2510 Atherosclerotic heart disease of native coronary artery without angina pectoris: Secondary | ICD-10-CM | POA: Diagnosis present

## 2014-11-08 DIAGNOSIS — N390 Urinary tract infection, site not specified: Secondary | ICD-10-CM | POA: Diagnosis present

## 2014-11-08 LAB — CBC
HCT: 36.9 % (ref 36.0–46.0)
Hemoglobin: 12 g/dL (ref 12.0–15.0)
MCH: 27.8 pg (ref 26.0–34.0)
MCHC: 32.5 g/dL (ref 30.0–36.0)
MCV: 85.4 fL (ref 78.0–100.0)
PLATELETS: 270 10*3/uL (ref 150–400)
RBC: 4.32 MIL/uL (ref 3.87–5.11)
RDW: 13.8 % (ref 11.5–15.5)
WBC: 4 10*3/uL (ref 4.0–10.5)

## 2014-11-08 LAB — TSH: TSH: 1.576 u[IU]/mL (ref 0.350–4.500)

## 2014-11-08 LAB — CREATININE, SERUM
CREATININE: 1.33 mg/dL — AB (ref 0.50–1.10)
GFR calc Af Amer: 50 mL/min — ABNORMAL LOW (ref >90)
GFR, EST NON AFRICAN AMERICAN: 43 mL/min — AB (ref >90)

## 2014-11-08 LAB — TROPONIN I
Troponin I: <0.03 ng/mL (ref <0.031)
Troponin I: <0.03 ng/mL (ref <0.031)

## 2014-11-08 LAB — MRSA PCR SCREENING: MRSA by PCR: NEGATIVE

## 2014-11-08 MED ORDER — ADULT MULTIVITAMIN W/MINERALS CH
1.0000 | ORAL_TABLET | Freq: Every day | ORAL | Status: DC
  Administered 2014-11-08 – 2014-11-10 (×3): 1 via ORAL
  Filled 2014-11-08 (×3): qty 1

## 2014-11-08 MED ORDER — DOCUSATE SODIUM 100 MG PO CAPS
100.0000 mg | ORAL_CAPSULE | Freq: Two times a day (BID) | ORAL | Status: DC
  Administered 2014-11-09: 100 mg via ORAL
  Filled 2014-11-08 (×3): qty 1

## 2014-11-08 MED ORDER — CIPROFLOXACIN HCL 500 MG PO TABS
250.0000 mg | ORAL_TABLET | Freq: Two times a day (BID) | ORAL | Status: DC
  Administered 2014-11-08 – 2014-11-10 (×4): 250 mg via ORAL
  Filled 2014-11-08 (×4): qty 1

## 2014-11-08 MED ORDER — ALUM & MAG HYDROXIDE-SIMETH 200-200-20 MG/5ML PO SUSP: 30.0000 mL | Freq: Four times a day (QID) | ORAL | Status: DC | PRN

## 2014-11-08 MED ORDER — ONDANSETRON HCL 4 MG/2ML IJ SOLN: 4.0000 mg | Freq: Four times a day (QID) | INTRAMUSCULAR | Status: DC | PRN

## 2014-11-08 MED ORDER — SODIUM CHLORIDE 0.9 % IV SOLN
INTRAVENOUS | Status: AC
  Administered 2014-11-08 – 2014-11-09 (×2): via INTRAVENOUS

## 2014-11-08 MED ORDER — ONDANSETRON HCL 4 MG PO TABS: 4.0000 mg | ORAL_TABLET | Freq: Four times a day (QID) | ORAL | Status: DC | PRN

## 2014-11-08 MED ORDER — ACETAMINOPHEN 650 MG RE SUPP: 650.0000 mg | Freq: Four times a day (QID) | RECTAL | Status: DC | PRN

## 2014-11-08 MED ORDER — SODIUM CHLORIDE 0.9 % IJ SOLN
3.0000 mL | Freq: Two times a day (BID) | INTRAMUSCULAR | Status: DC
  Administered 2014-11-08 – 2014-11-10 (×4): 3 mL via INTRAVENOUS

## 2014-11-08 MED ORDER — HEPARIN SODIUM (PORCINE) 5000 UNIT/ML IJ SOLN
5000.0000 [IU] | Freq: Three times a day (TID) | INTRAMUSCULAR | Status: DC
  Administered 2014-11-08 – 2014-11-10 (×6): 5000 [IU] via SUBCUTANEOUS
  Filled 2014-11-08 (×6): qty 1

## 2014-11-08 MED ORDER — ACETAMINOPHEN 325 MG PO TABS: 650.0000 mg | ORAL_TABLET | Freq: Four times a day (QID) | ORAL | Status: DC | PRN

## 2014-11-08 MED ORDER — ASPIRIN EC 81 MG PO TBEC
81.0000 mg | DELAYED_RELEASE_TABLET | Freq: Every day | ORAL | Status: DC
  Administered 2014-11-08 – 2014-11-10 (×3): 81 mg via ORAL
  Filled 2014-11-08 (×3): qty 1

## 2014-11-08 NOTE — H&P (Cosign Needed)
Referring Physician:  JAHLIA Palmer is an 59 y.o. female.                       Chief Complaint: Sylvia Palmer out  HPI: 59 yr old female passed out x 2 in 2 days. Last one at home when on commode. No palpitation or chest pain. Blood work by [facility] shows normal CBC, mildly elevated BUN/Cr at 18/1.5 and UA positive for UTI. Chest x-ray is unremarkable.  Past Medical History  Diagnosis Date  . Hypertension   . Stroke     mini strokes  . Arthritis       Past Surgical History  Procedure Laterality Date  . Abdominal hysterectomy    . Other surgical history      implantable cardiac septal occluder times 4(holes in Heart)  . Cervical discectomy      No family history on file. Social History:  reports that she has never smoked. She does not have any smokeless tobacco history on file. She reports that she does not drink alcohol or use illicit drugs.  Allergies:  Allergies  Allergen Reactions  . Dilaudid [Hydromorphone Hcl] Nausea And Vomiting  . Morphine And Related Nausea And Vomiting  . Latex Itching and Rash    Medications Prior to Admission  Medication Sig Dispense Refill  . aspirin EC 81 MG tablet Take 81 mg by mouth daily.    . Chlorpheniramine-Acetaminophen (CORICIDIN HBP COLD/FLU PO) Take 1 tablet by mouth daily as needed. Cold symptoms    . diazepam (VALIUM) 5 MG tablet Take 5 mg by mouth at bedtime as needed. spasm    . valsartan-hydrochlorothiazide (DIOVAN-HCT) 160-12.5 MG per tablet Take 1 tablet by mouth daily.      Results for orders placed or performed during the hospital encounter of 11/08/14 (from the past 48 hour(s))  MRSA PCR Screening     Status: None   Collection Time: 11/08/14 12:31 PM  Result Value Ref Range   MRSA by PCR NEGATIVE NEGATIVE    Comment:        The GeneXpert MRSA Assay (FDA approved for NASAL specimens only), is one component of a comprehensive MRSA colonization surveillance program. It is not intended to diagnose MRSA infection  nor to guide or monitor treatment for MRSA infections.   CBC     Status: None   Collection Time: 11/08/14  4:05 PM  Result Value Ref Range   WBC 4.0 4.0 - 10.5 K/uL   RBC 4.32 3.87 - 5.11 MIL/uL   Hemoglobin 12.0 12.0 - 15.0 g/dL   HCT 36.9 36.0 - 46.0 %   MCV 85.4 78.0 - 100.0 fL   MCH 27.8 26.0 - 34.0 pg   MCHC 32.5 30.0 - 36.0 g/dL   RDW 13.8 11.5 - 15.5 %   Platelets 270 150 - 400 K/uL  Creatinine, serum     Status: Abnormal   Collection Time: 11/08/14  4:05 PM  Result Value Ref Range   Creatinine, Ser 1.33 (H) 0.50 - 1.10 mg/dL   GFR calc non Af Amer 43 (L) >90 mL/min   GFR calc Af Amer 50 (L) >90 mL/min    Comment: (NOTE) The eGFR has been calculated using the CKD EPI equation. This calculation has not been validated in all clinical situations. eGFR's persistently <90 mL/min signify possible Chronic Kidney Disease.   Troponin I     Status: None   Collection Time: 11/08/14  4:05 PM  Result Value Ref  Range   Troponin I <0.03 <0.031 ng/mL    Comment:        NO INDICATION OF MYOCARDIAL INJURY.    No results found.  Review Of Systems Constitutional: Negative for fever and chills. Eyes: Negative for eye pain, redness and discharge.  ENMT: Negative for ear pain, hoarseness, nasal congestion, sinus pressure and sore throat.  Cardiovascular: No CP, SOB. Negative for palpitations, diaphoresis, and peripheral edema. Respiratory: Negative for cough, wheezing and stridor.  Gastrointestinal: Negative for nausea, vomiting, diarrhea, abdominal pain, blood in stool, hematemesis, jaundice and rectal bleeding.  Genitourinary: Negative for dysuria, flank pain and hematuria. Musculoskeletal: Negative for back pain and neck pain. Negative for swelling and trauma. Skin: Negative for pruritus, rash, abrasions, blisters, bruising and skin lesion. Neuro: +headache, lightheadedness, syncope. Negative for neck stiffness. Negative for weakness, paresthesias, involuntary movement,  seizure.  Blood pressure 118/75, pulse 92, temperature 99.4 F (37.4 C), temperature source Oral, resp. rate 20, SpO2 100 %.  General appearance: alert, appears stated age and no distress Eyes: negative findings: lids and lashes normal. ENT-Dry, pink midline tongue Neck: No JVD, supple, symmetrical, trachea midline, thyroid not enlarged, symmetric, no tenderness/mass/nodules  Resp: Clear bilateral. Chest wall: no tenderness. Cardio: S1, S2 normal, no S3 or S4. II/VI systolic murmur. GI: Soft, non-tender; bowel sounds normal; no masses, no organomegaly. Extremities: normal, atraumatic, no cyanosis or edema. Pulses: 2+ and symmetric Skin: Skin color, texture, turgor normal. No rashes or lesions Neurologic: Grossly normal  Assessment/Plan Syncope H/O mini strokes CAD S/P 3 fenestrated atrial septal defects and PFO closure(06/2007) Hypertension  Cervical discectomy Lumbar degenerative disc disease with fusion UTI  IV fluids/Echocardiogram/PO cipro/Home medications. Hold HCTZ.  Birdie Riddle, MD  11/08/2014, 5:13 PM

## 2014-11-09 ENCOUNTER — Encounter (HOSPITAL_COMMUNITY): Payer: Self-pay | Admitting: *Deleted

## 2014-11-09 ENCOUNTER — Inpatient Hospital Stay (HOSPITAL_COMMUNITY): Payer: Medicaid Other

## 2014-11-09 ENCOUNTER — Ambulatory Visit (HOSPITAL_COMMUNITY): Payer: Self-pay | Attending: Cardiovascular Disease

## 2014-11-09 ENCOUNTER — Ambulatory Visit (HOSPITAL_COMMUNITY): Payer: Self-pay

## 2014-11-09 LAB — CBC
HCT: 34.8 % — ABNORMAL LOW (ref 36.0–46.0)
Hemoglobin: 11.3 g/dL — ABNORMAL LOW (ref 12.0–15.0)
MCH: 28.1 pg (ref 26.0–34.0)
MCHC: 32.5 g/dL (ref 30.0–36.0)
MCV: 86.6 fL (ref 78.0–100.0)
Platelets: 261 10*3/uL (ref 150–400)
RBC: 4.02 MIL/uL (ref 3.87–5.11)
RDW: 14 % (ref 11.5–15.5)
WBC: 4 10*3/uL (ref 4.0–10.5)

## 2014-11-09 LAB — PROTIME-INR
INR: 1.12 (ref 0.00–1.49)
PROTHROMBIN TIME: 14.5 s (ref 11.6–15.2)

## 2014-11-09 LAB — LIPID PANEL
CHOL/HDL RATIO: 2.5 ratio
Cholesterol: 134 mg/dL (ref 0–200)
HDL: 54 mg/dL (ref >39)
LDL Cholesterol: 68 mg/dL (ref 0–99)
Triglycerides: 59 mg/dL (ref <150)
VLDL: 12 mg/dL (ref 0–40)

## 2014-11-09 LAB — BASIC METABOLIC PANEL
Anion gap: 4 — ABNORMAL LOW (ref 5–15)
BUN: 16 mg/dL (ref 6–23)
CALCIUM: 8.4 mg/dL (ref 8.4–10.5)
CHLORIDE: 105 mmol/L (ref 96–112)
CO2: 29 mmol/L (ref 19–32)
CREATININE: 1.34 mg/dL — AB (ref 0.50–1.10)
GFR, EST AFRICAN AMERICAN: 50 mL/min — AB (ref >90)
GFR, EST NON AFRICAN AMERICAN: 43 mL/min — AB (ref >90)
Glucose, Bld: 101 mg/dL — ABNORMAL HIGH (ref 70–99)
Potassium: 4.6 mmol/L (ref 3.5–5.1)
SODIUM: 138 mmol/L (ref 135–145)

## 2014-11-09 LAB — TROPONIN I

## 2014-11-09 MED ORDER — REGADENOSON 0.4 MG/5ML IV SOLN
0.4000 mg | Freq: Once | INTRAVENOUS | Status: AC
  Administered 2014-11-09: 0.4 mg via INTRAVENOUS
  Filled 2014-11-09: qty 5

## 2014-11-09 MED ORDER — REGADENOSON 0.4 MG/5ML IV SOLN
INTRAVENOUS | Status: AC
  Administered 2014-11-09: 0.4 mg via INTRAVENOUS
  Filled 2014-11-09: qty 5

## 2014-11-09 MED ORDER — TECHNETIUM TC 99M SESTAMIBI GENERIC - CARDIOLITE
10.0000 | Freq: Once | INTRAVENOUS | Status: AC | PRN
  Administered 2014-11-09: 10 via INTRAVENOUS

## 2014-11-09 MED ORDER — MECLIZINE HCL 25 MG PO TABS
12.5000 mg | ORAL_TABLET | Freq: Two times a day (BID) | ORAL | Status: DC
Start: 2014-11-09 — End: 2014-11-10
  Administered 2014-11-09 – 2014-11-10 (×2): 12.5 mg via ORAL
  Filled 2014-11-09 (×2): qty 1

## 2014-11-09 MED ORDER — TECHNETIUM TC 99M SESTAMIBI GENERIC - CARDIOLITE
30.0000 | Freq: Once | INTRAVENOUS | Status: AC | PRN
  Administered 2014-11-09: 30 via INTRAVENOUS

## 2014-11-09 NOTE — Progress Notes (Signed)
  Echocardiogram 2D Echocardiogram has been performed.  Sylvia Palmer 11/09/2014, 4:09 PM

## 2014-11-09 NOTE — Progress Notes (Signed)
Ref: Mateo Flow, MD   Subjective:  Little better. Has some tendency to lean on one side at times. No speech problem or extremity weakness. Nuclear stress test without reversible ischemia but quality of rest images are poor. T max 99.1 degree F  Objective:  Vital Signs in the last 24 hours: Temp:  [98.7 F (37.1 C)-99.1 F (37.3 C)] 99.1 F (37.3 C) (03/26 1447) Pulse Rate:  [72-105] 75 (03/26 1447) Cardiac Rhythm:  [-]  Resp:  [18] 18 (03/26 1447) BP: (91-115)/(45-71) 111/71 mmHg (03/26 1447) SpO2:  [94 %-97 %] 97 % (03/26 0646) Weight:  [88.355 kg (194 lb 12.6 oz)] 88.355 kg (194 lb 12.6 oz) (03/26 0646)  Physical Exam: BP Readings from Last 1 Encounters:  11/09/14 111/71    Wt Readings from Last 1 Encounters:  11/09/14 88.355 kg (194 lb 12.6 oz)    Weight change:   HEENT: [place]/AT, Eyes-Hazel, PERL, EOMI, Conjunctiva-Pink, Sclera-Non-icteric Neck: No JVD, No bruit, Trachea midline. Lungs:  Clear, Bilateral. Cardiac:  Regular rhythm, normal S1 and S2, no S3.  Abdomen:  Soft, non-tender. Extremities:  No edema present. No cyanosis. No clubbing. CNS: AxOx3, Cranial nerves grossly intact, moves all 4 extremities. Right handed. + Romberg sign. Skin: Warm and dry.   Intake/Output from previous day: 03/25 0701 - 03/26 0700 In: 1450 [P.O.:350; I.V.:1100] Out: -     Lab Results: BMET    Component Value Date/Time   NA 138 11/09/2014 0252   NA 137 06/12/2012 1341   NA 142 06/27/2008 1407   K 4.6 11/09/2014 0252   K 3.4* 06/12/2012 1341   K 4.4 06/27/2008 1407   CL 105 11/09/2014 0252   CL 100 06/12/2012 1341   CL 108 06/27/2008 1407   CO2 29 11/09/2014 0252   CO2 27 06/12/2012 1341   CO2 30 06/27/2008 1407   GLUCOSE 101* 11/09/2014 0252   GLUCOSE 90 06/12/2012 1341   GLUCOSE 85 06/27/2008 1407   BUN 16 11/09/2014 0252   BUN 10 06/12/2012 1341   BUN 10 06/27/2008 1407   CREATININE 1.34* 11/09/2014 0252   CREATININE 1.33* 11/08/2014 1605   CREATININE 1.00  06/12/2012 1341   CALCIUM 8.4 11/09/2014 0252   CALCIUM 9.5 06/12/2012 1341   CALCIUM 9.6 06/27/2008 1407   GFRNONAA 43* 11/09/2014 0252   GFRNONAA 43* 11/08/2014 1605   GFRNONAA 62* 06/12/2012 1341   GFRAA 50* 11/09/2014 0252   GFRAA 50* 11/08/2014 1605   GFRAA 72* 06/12/2012 1341   CBC    Component Value Date/Time   WBC 4.0 11/09/2014 0252   RBC 4.02 11/09/2014 0252   HGB 11.3* 11/09/2014 0252   HCT 34.8* 11/09/2014 0252   PLT 261 11/09/2014 0252   MCV 86.6 11/09/2014 0252   MCH 28.1 11/09/2014 0252   MCHC 32.5 11/09/2014 0252   RDW 14.0 11/09/2014 0252   HEPATIC Function Panel No results for input(s): PROT in the last 8760 hours.  Invalid input(s):  ALBUMIN,  AST,  ALT,  ALKPHOS,  BILIDIR,  IBILI HEMOGLOBIN A1C No components found for: HGA1C,  MPG CARDIAC ENZYMES Lab Results  Component Value Date   TROPONINI <0.03 11/09/2014   TROPONINI <0.03 11/08/2014   TROPONINI <0.03 11/08/2014   BNP No results for input(s): PROBNP in the last 8760 hours. TSH  Recent Labs  11/08/14 1605  TSH 1.576   CHOLESTEROL  Recent Labs  11/09/14 0252  CHOL 134    Scheduled Meds: . aspirin EC  81 mg Oral Daily  .  ciprofloxacin  250 mg Oral BID  . docusate sodium  100 mg Oral BID  . heparin  5,000 Units Subcutaneous 3 times per day  . multivitamin with minerals  1 tablet Oral Daily  . sodium chloride  3 mL Intravenous Q12H   Continuous Infusions:  PRN Meds:.acetaminophen **OR** acetaminophen, alum & mag hydroxide-simeth, ondansetron **OR** ondansetron (ZOFRAN) IV  Assessment/Plan: Syncope Vertebrobasilar insufficiency H/O mini strokes CAD S/P 3 fenestrated atrial septal defects and PFO closure(06/2007) Hypertension  Cervical discectomy Lumbar degenerative disc disease with fusion UTI  No MRI per patient due to atrial septal occluders. CT without contrast. Discussed slow activity, cane or walker use and prn meclizine use.     LOS: 1 day    Dixie Dials  MD   11/09/2014, 8:36 PM

## 2014-11-10 LAB — URINE CULTURE

## 2014-11-10 MED ORDER — MECLIZINE HCL 12.5 MG PO TABS: 12.5000 mg | ORAL_TABLET | Freq: Two times a day (BID) | ORAL | Status: AC

## 2014-11-10 MED ORDER — CIPROFLOXACIN HCL 250 MG PO TABS: 250.0000 mg | ORAL_TABLET | Freq: Two times a day (BID) | ORAL | Status: AC

## 2014-11-10 NOTE — Discharge Summary (Signed)
Physician Discharge Summary  Patient ID: Sylvia Palmer MRN: [id number] DOB/AGE: 1956/02/01 59 y.o.  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 Last one at home when on commode. No palpitation or chest pain. Blood work by [facility] showed normal CBC, mildly elevated BUN/Cr at 18/1.5 and UA positive for UTI. Chest x-ray was unremarkable. She was hypotensive and tachycardic on admission. Her nuclear stress test was negative for reversible ischemia but rest images were sub-optimal quality and EF by echocardiogram was mildly low at 45 to 50 %. Her blood pressure and tachycardia improved with discontinuation of Valsartan and HCTZ. She had positive vertigo with change in position and with Romberg test. She responded well to meclizine use and agreed to change position little slowly and use cane or walker for few days to weeks. She will see primary care Dr. Bertram Millard in 2 weeks and Dr.  Adrian Prows in 1 month or as needed.   Consults: cardiology  Significant Diagnostic Studies: labs: CBC near normal, Lipid panel, TSH were normal. Electrolytes normal and BUN/Cr borderline high at 16/1.34.  Echocardiogram: Left ventricle: The cavity size was normal. There was mild concentric hypertrophy. Systolic function was mildly reduced. The estimated ejection fraction was in the range of 45% to  50%. Hypokinesis of the apical myocardium.  CT head : No acute intracranial process. Chronic small vessel ischemic change.   Nuclear stress test: 1. No reversible ischemia or infarction. 2. Hypokinesia at the apex. 3. Left ventricular ejection fraction 60%. 4. Low-risk stress test findings*.  Treatments: Hydration, Meclizine and discontinuation of Valsartan-HCTZ.  Discharge Exam: Blood pressure 105/57, pulse 67, temperature 98.5 F (36.9 C), temperature source Oral, resp. rate 18, height 5\' 7"  (1.702 m), weight 88.451 kg (195 lb), SpO2 95 %. HEENT: [place]/AT, Eyes-Hazel, PERL, EOMI, Conjunctiva-Pink, Sclera-Non-icteric Neck: No JVD, No bruit, Trachea midline. Lungs: Clear, Bilateral. Cardiac: Regular rhythm, normal S1 and S2, no S3.  Abdomen: Soft, non-tender. Extremities: No edema present. No cyanosis. No clubbing. CNS: AxOx3, Cranial nerves grossly intact, moves all 4 extremities. Right handed. + Romberg sign. + Positional vertigo. Skin: Warm and dry.  Disposition: 01-Home or Self Care     Medication List    STOP taking these medications        valsartan-hydrochlorothiazide 160-12.5 MG per tablet  Commonly known as:  DIOVAN-HCT      TAKE these medications        aspirin EC 81 MG tablet  Take 81 mg by mouth daily.     ciprofloxacin 250 MG tablet  Commonly known as:  CIPRO  Take 1 tablet (250 mg total) by mouth 2 (two) times daily.     CORICIDIN HBP COLD/FLU PO  Take 1 tablet by mouth daily as needed. Cold symptoms     diazepam 5 MG tablet  Commonly known as:  VALIUM  Take 5 mg by mouth at bedtime as needed. spasm     meclizine 12.5 MG tablet  Commonly known as:  ANTIVERT  Take 1  tablet (12.5 mg total) by mouth 2 (two) times daily.           Follow-up Information    Follow up with [facility] A, MD. Schedule an appointment as soon as possible for a visit in 2 weeks.   Specialty:  Family Medicine   Contact information:   [place]  [id number] [phone]       Follow up with Laverda Page, MD. Schedule an appointment as soon as possible for a visit in 1 month.   Specialty:  Cardiology   Contact information:   [address] [place] [address] [phone]       Signed: Birdie Riddle 11/10/2014, 12:49 PM

## 2016-07-06 ENCOUNTER — Other Ambulatory Visit: Payer: Self-pay | Admitting: Orthopaedic Surgery

## 2016-07-06 DIAGNOSIS — M5003 Cervical disc disorder with myelopathy, cervicothoracic region: Secondary | ICD-10-CM

## 2016-07-15 ENCOUNTER — Ambulatory Visit
Admission: RE | Admit: 2016-07-15 | Discharge: 2016-07-15 | Disposition: A | Discharge status: Home / Self Care | Payer: Medicaid Other | Source: Ambulatory Visit | Attending: Orthopaedic Surgery | Admitting: Orthopaedic Surgery

## 2016-07-15 DIAGNOSIS — M5003 Cervical disc disorder with myelopathy, cervicothoracic region: Secondary | ICD-10-CM

## 2016-08-26 DIAGNOSIS — Z01810 Encounter for preprocedural cardiovascular examination: Secondary | ICD-10-CM | POA: Diagnosis not present

## 2016-08-26 DIAGNOSIS — I43 Cardiomyopathy in diseases classified elsewhere: Secondary | ICD-10-CM | POA: Diagnosis not present

## 2016-08-26 DIAGNOSIS — I119 Hypertensive heart disease without heart failure: Secondary | ICD-10-CM | POA: Diagnosis not present

## 2016-08-26 DIAGNOSIS — I10 Essential (primary) hypertension: Secondary | ICD-10-CM | POA: Diagnosis not present

## 2016-09-10 DIAGNOSIS — M5001 Cervical disc disorder with myelopathy,  high cervical region: Secondary | ICD-10-CM | POA: Diagnosis not present

## 2016-09-10 DIAGNOSIS — M542 Cervicalgia: Secondary | ICD-10-CM | POA: Diagnosis not present

## 2016-09-10 DIAGNOSIS — M50023 Cervical disc disorder at C6-C7 level with myelopathy: Secondary | ICD-10-CM | POA: Diagnosis not present

## 2016-09-10 DIAGNOSIS — Z6829 Body mass index (BMI) 29.0-29.9, adult: Secondary | ICD-10-CM | POA: Diagnosis not present

## 2016-09-10 DIAGNOSIS — Z01812 Encounter for preprocedural laboratory examination: Secondary | ICD-10-CM | POA: Diagnosis not present

## 2016-09-14 DIAGNOSIS — I10 Essential (primary) hypertension: Secondary | ICD-10-CM | POA: Diagnosis not present

## 2016-09-14 DIAGNOSIS — Z981 Arthrodesis status: Secondary | ICD-10-CM | POA: Diagnosis not present

## 2016-09-14 DIAGNOSIS — M50121 Cervical disc disorder at C4-C5 level with radiculopathy: Secondary | ICD-10-CM | POA: Diagnosis not present

## 2016-09-14 DIAGNOSIS — M50023 Cervical disc disorder at C6-C7 level with myelopathy: Secondary | ICD-10-CM | POA: Diagnosis not present

## 2016-09-14 DIAGNOSIS — M439 Deforming dorsopathy, unspecified: Secondary | ICD-10-CM | POA: Diagnosis not present

## 2016-09-14 DIAGNOSIS — G43909 Migraine, unspecified, not intractable, without status migrainosus: Secondary | ICD-10-CM | POA: Diagnosis not present

## 2016-09-14 DIAGNOSIS — M4802 Spinal stenosis, cervical region: Secondary | ICD-10-CM | POA: Diagnosis not present

## 2016-09-14 DIAGNOSIS — M50122 Cervical disc disorder at C5-C6 level with radiculopathy: Secondary | ICD-10-CM | POA: Diagnosis not present

## 2016-09-14 DIAGNOSIS — M5001 Cervical disc disorder with myelopathy,  high cervical region: Secondary | ICD-10-CM | POA: Diagnosis not present

## 2016-09-14 DIAGNOSIS — T84296A Other mechanical complication of internal fixation device of vertebrae, initial encounter: Secondary | ICD-10-CM | POA: Diagnosis not present

## 2016-09-14 DIAGNOSIS — M961 Postlaminectomy syndrome, not elsewhere classified: Secondary | ICD-10-CM | POA: Diagnosis not present

## 2016-09-14 DIAGNOSIS — M5135 Other intervertebral disc degeneration, thoracolumbar region: Secondary | ICD-10-CM | POA: Diagnosis not present

## 2016-09-14 DIAGNOSIS — M4712 Other spondylosis with myelopathy, cervical region: Secondary | ICD-10-CM | POA: Diagnosis not present

## 2016-09-14 DIAGNOSIS — M47812 Spondylosis without myelopathy or radiculopathy, cervical region: Secondary | ICD-10-CM | POA: Diagnosis not present

## 2016-10-15 DIAGNOSIS — M50023 Cervical disc disorder at C6-C7 level with myelopathy: Secondary | ICD-10-CM | POA: Diagnosis not present

## 2016-12-16 DIAGNOSIS — M542 Cervicalgia: Secondary | ICD-10-CM | POA: Diagnosis not present

## 2016-12-16 DIAGNOSIS — M963 Postlaminectomy kyphosis: Secondary | ICD-10-CM | POA: Diagnosis not present

## 2016-12-16 DIAGNOSIS — M545 Low back pain: Secondary | ICD-10-CM | POA: Diagnosis not present

## 2016-12-16 DIAGNOSIS — M5003 Cervical disc disorder with myelopathy, cervicothoracic region: Secondary | ICD-10-CM | POA: Diagnosis not present

## 2017-02-14 DIAGNOSIS — S0083XA Contusion of other part of head, initial encounter: Secondary | ICD-10-CM | POA: Diagnosis not present

## 2017-02-14 DIAGNOSIS — S060X0A Concussion without loss of consciousness, initial encounter: Secondary | ICD-10-CM | POA: Diagnosis not present

## 2017-02-14 DIAGNOSIS — W010XXA Fall on same level from slipping, tripping and stumbling without subsequent striking against object, initial encounter: Secondary | ICD-10-CM | POA: Diagnosis not present

## 2017-02-14 DIAGNOSIS — S0081XA Abrasion of other part of head, initial encounter: Secondary | ICD-10-CM | POA: Diagnosis not present

## 2017-02-22 DIAGNOSIS — M4326 Fusion of spine, lumbar region: Secondary | ICD-10-CM | POA: Diagnosis not present

## 2017-02-22 DIAGNOSIS — M4322 Fusion of spine, cervical region: Secondary | ICD-10-CM | POA: Diagnosis not present

## 2017-02-22 DIAGNOSIS — M542 Cervicalgia: Secondary | ICD-10-CM | POA: Diagnosis not present

## 2017-02-22 DIAGNOSIS — M545 Low back pain: Secondary | ICD-10-CM | POA: Diagnosis not present

## 2017-02-24 DIAGNOSIS — S060X9A Concussion with loss of consciousness of unspecified duration, initial encounter: Secondary | ICD-10-CM | POA: Diagnosis not present

## 2017-02-24 DIAGNOSIS — R42 Dizziness and giddiness: Secondary | ICD-10-CM | POA: Diagnosis not present

## 2017-02-24 DIAGNOSIS — Z79899 Other long term (current) drug therapy: Secondary | ICD-10-CM | POA: Diagnosis not present

## 2017-02-24 DIAGNOSIS — I10 Essential (primary) hypertension: Secondary | ICD-10-CM | POA: Diagnosis not present

## 2017-02-24 DIAGNOSIS — Z1389 Encounter for screening for other disorder: Secondary | ICD-10-CM | POA: Diagnosis not present

## 2017-02-24 DIAGNOSIS — Z6833 Body mass index (BMI) 33.0-33.9, adult: Secondary | ICD-10-CM | POA: Diagnosis not present

## 2017-07-20 DIAGNOSIS — G894 Chronic pain syndrome: Secondary | ICD-10-CM | POA: Diagnosis not present

## 2017-07-20 DIAGNOSIS — M4322 Fusion of spine, cervical region: Secondary | ICD-10-CM | POA: Diagnosis not present

## 2017-07-20 DIAGNOSIS — M4326 Fusion of spine, lumbar region: Secondary | ICD-10-CM | POA: Diagnosis not present

## 2017-07-20 DIAGNOSIS — M545 Low back pain: Secondary | ICD-10-CM | POA: Diagnosis not present

## 2017-08-01 DIAGNOSIS — I10 Essential (primary) hypertension: Secondary | ICD-10-CM | POA: Diagnosis not present

## 2017-08-01 DIAGNOSIS — Z6833 Body mass index (BMI) 33.0-33.9, adult: Secondary | ICD-10-CM | POA: Diagnosis not present

## 2017-08-04 IMAGING — CT CT CERVICAL SPINE W/O CM
3 of 4 series · 12 of 33 positions shown, 14 images · non-contrast
Comparison: Cervical spine CT myelogram 05/21/2014

CLINICAL DATA: Cervical disc disorder with myelopathy. Left-sided
neck numbness for 6 months. Prior cervical fusion.

EXAM:
CT CERVICAL SPINE WITHOUT CONTRAST
TECHNIQUE: Multidetector CT imaging of the cervical spine was performed without
intravenous contrast. Multiplanar CT image reconstructions were also
generated.

[Series 6: cor · coronal · 0.20mm/px · 3 of 52 slices shown]
[im 11/52  bone]
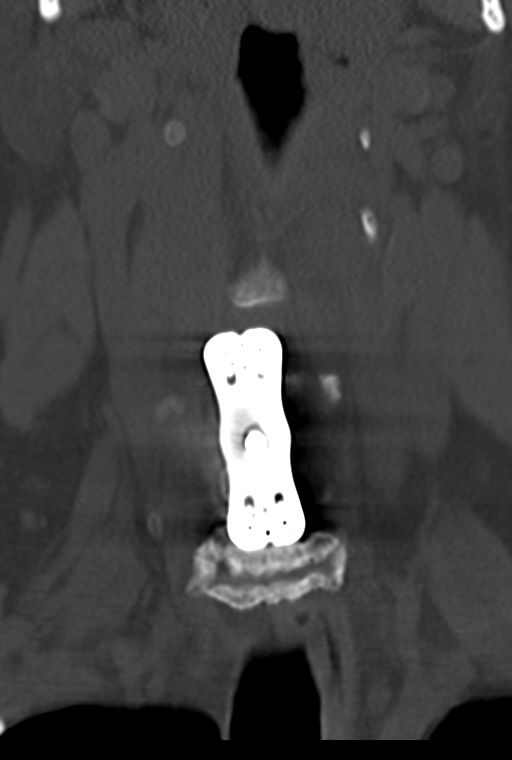
[im 21/52  bone]
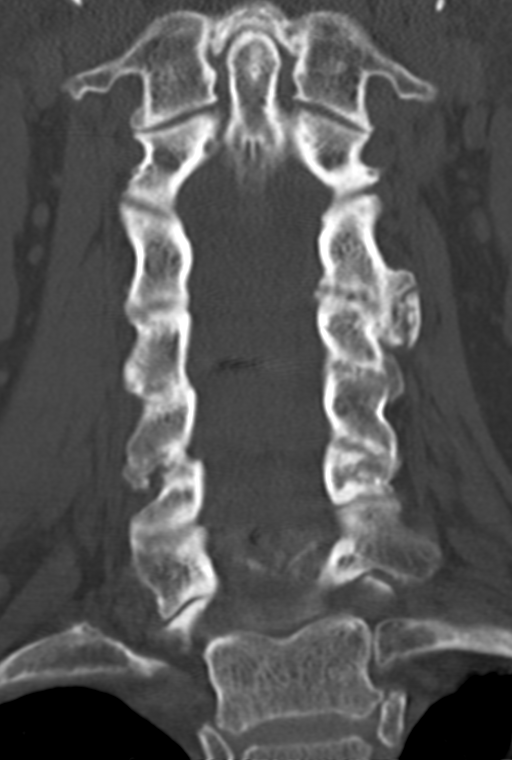
[im 31/52  bone]
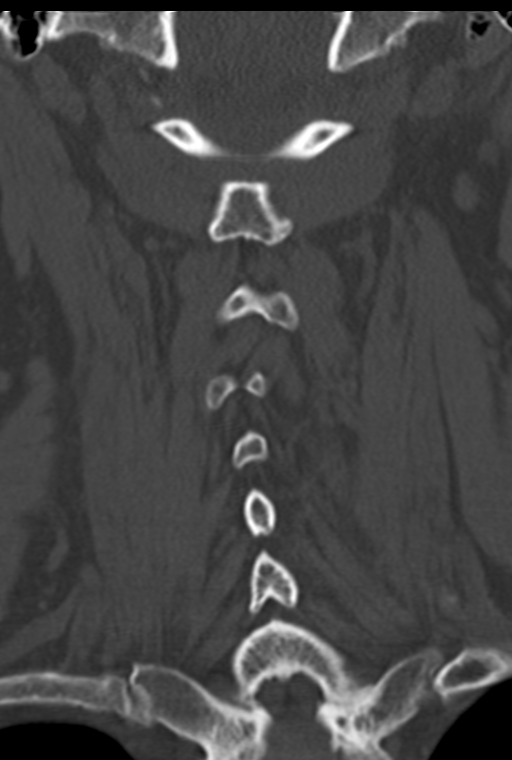

[Series 7: sag · sagittal · 0.21mm/px · 5 of 51 slices shown, 6 images]
[im 17/51  bone]
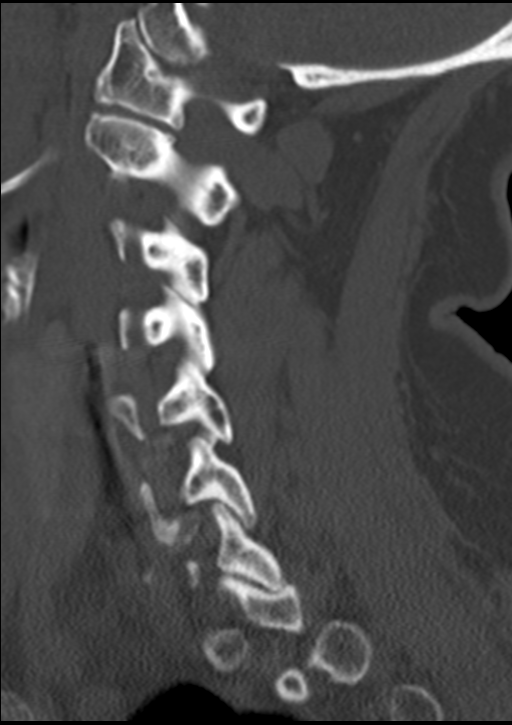
[im 21/51  bone]
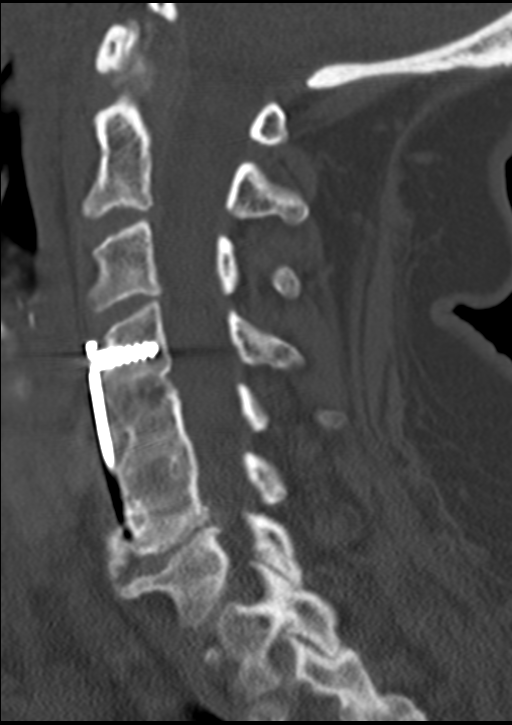
[im 26/51  soft-tissue]
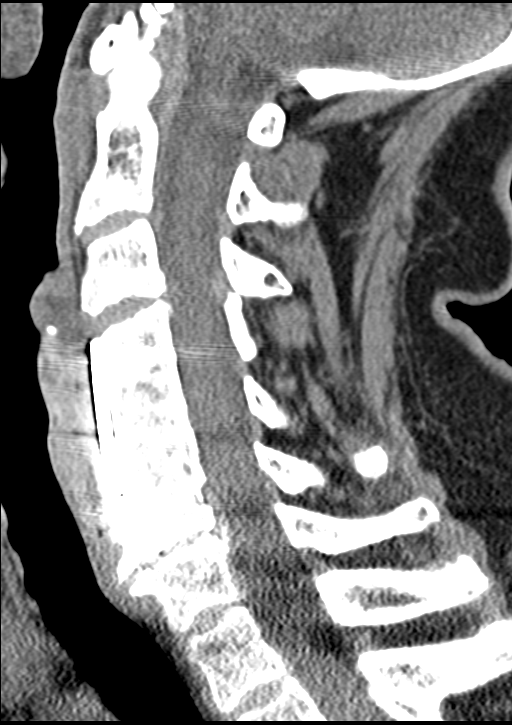
[im 26/51  bone]
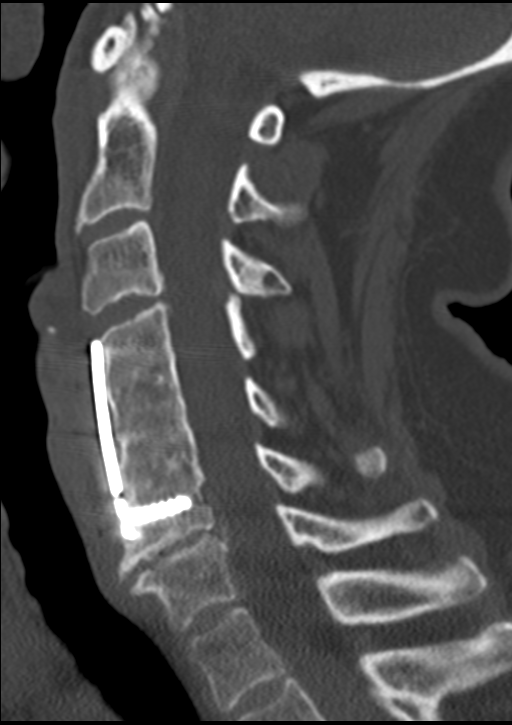
[im 30/51  bone]
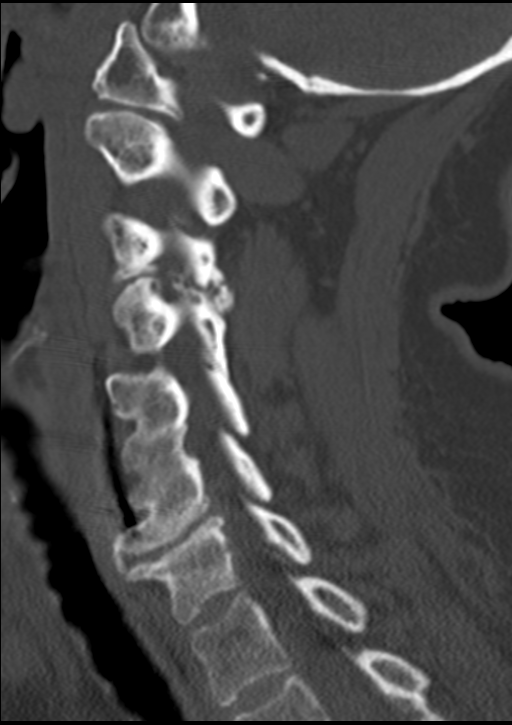
[im 34/51  bone]
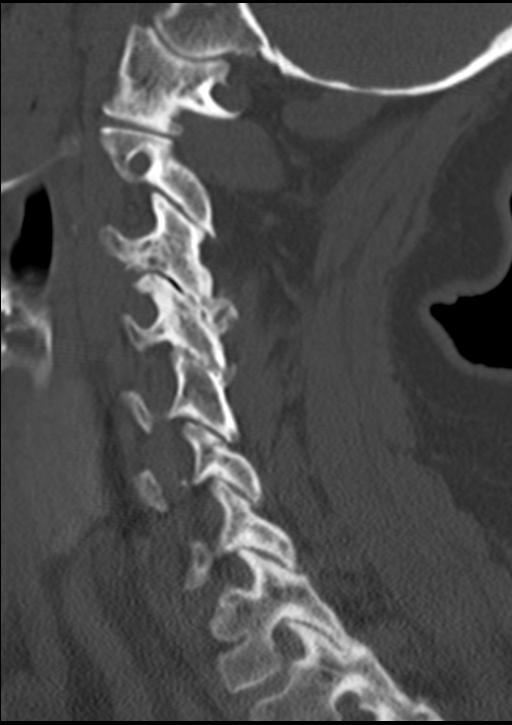

[Series 8: angled axial · axial · 0.19mm/px · z∈[-743,-642]mm · 4 of 77 slices shown, 5 images]
[im 13/77  soft-tissue]
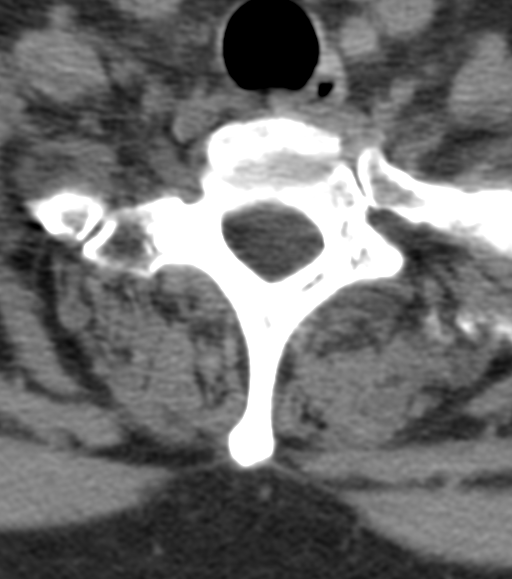
[im 13/77  bone]
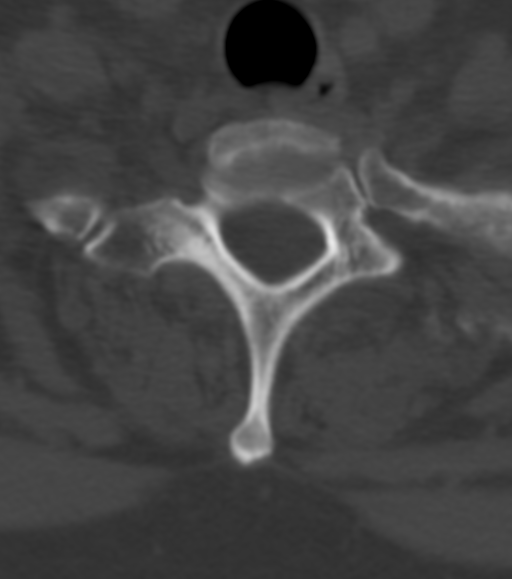
[im 26/77  bone]
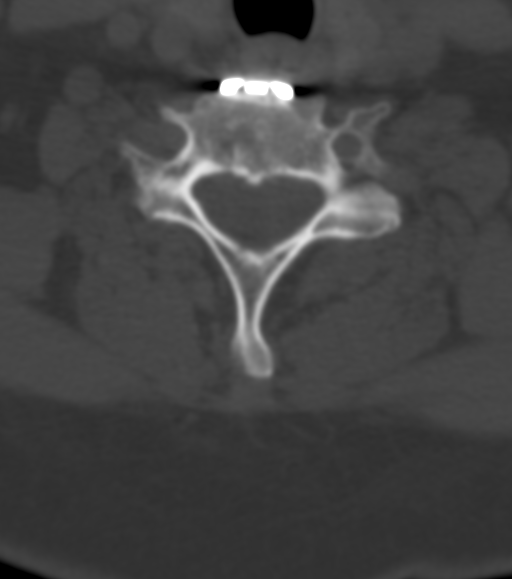
[im 51/77  bone]
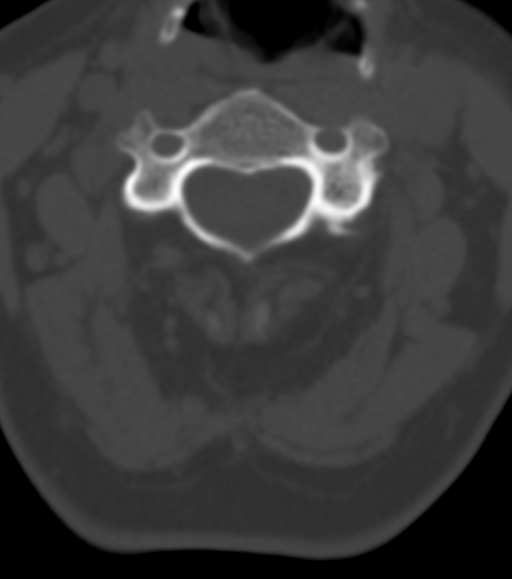
[im 64/77  bone]
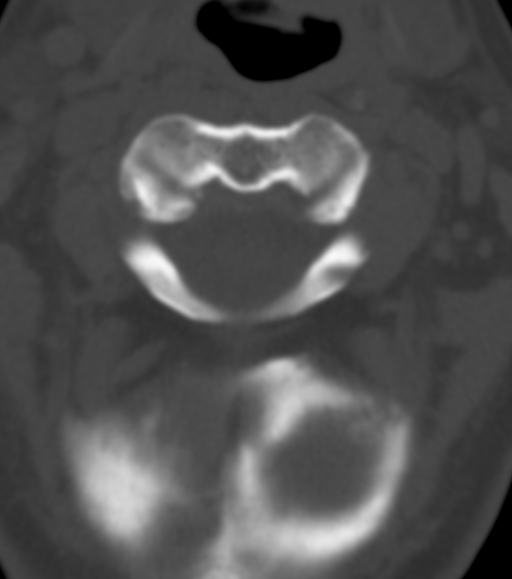

[12 of 33 positions shown; findings below may reference images not displayed]

FINDINGS: Alignment: Normal.

Skull base and vertebrae: No fracture or destructive osseous lesion.
Prior C4-C6 ACDF with solid interbody osseous fusion at both levels.
There is also partial ankylosis across the facet joints bilaterally
at both levels. Mild degenerative change at C1-2.

Soft tissues and spinal canal: No prevertebral fluid or swelling. No
visible canal hematoma.

Upper chest: Unremarkable.

Other: None.

Disc levels:

C2-3:  No significant abnormality.

C3-4: Central to left paracentral disc protrusion, asymmetric left
uncovertebral spurring, and progressive severe left facet arthrosis
result in mild spinal stenosis and severe left neural foraminal
stenosis.

C4-5:  Prior ACDF.  No stenosis.

C5-6: Prior ACDF. Minimal left-sided osseous neural foraminal
narrowing, unchanged. No spinal stenosis.

C6-7: Severe disc space narrowing with prominent anterior spurring.
Mild disc bulging and right greater than left uncovertebral spurring
result in borderline spinal stenosis and moderate right neural
foraminal stenosis, not significantly changed.

C7-T1:  Negative.
IMPRESSION: 1. Progressive, severe left facet arthrosis at C3-4 with severe left
foraminal stenosis.
2. Unchanged, mild spinal stenosis at C3-4 and moderate right
foraminal stenosis at C6-7.
3. Solid C4-C6 ACDF without significant residual stenosis.

## 2017-12-20 DIAGNOSIS — D485 Neoplasm of uncertain behavior of skin: Secondary | ICD-10-CM | POA: Diagnosis not present

## 2018-01-02 DIAGNOSIS — M4326 Fusion of spine, lumbar region: Secondary | ICD-10-CM | POA: Diagnosis not present

## 2018-01-02 DIAGNOSIS — G894 Chronic pain syndrome: Secondary | ICD-10-CM | POA: Diagnosis not present

## 2018-01-02 DIAGNOSIS — Z79899 Other long term (current) drug therapy: Secondary | ICD-10-CM | POA: Diagnosis not present

## 2018-01-02 DIAGNOSIS — Z79891 Long term (current) use of opiate analgesic: Secondary | ICD-10-CM | POA: Diagnosis not present

## 2018-01-02 DIAGNOSIS — M545 Low back pain: Secondary | ICD-10-CM | POA: Diagnosis not present

## 2018-01-17 DIAGNOSIS — C44612 Basal cell carcinoma of skin of right upper limb, including shoulder: Secondary | ICD-10-CM | POA: Diagnosis not present

## 2018-03-30 DIAGNOSIS — M4326 Fusion of spine, lumbar region: Secondary | ICD-10-CM | POA: Diagnosis not present

## 2018-03-30 DIAGNOSIS — G894 Chronic pain syndrome: Secondary | ICD-10-CM | POA: Diagnosis not present

## 2018-03-30 DIAGNOSIS — M4322 Fusion of spine, cervical region: Secondary | ICD-10-CM | POA: Diagnosis not present

## 2018-03-30 DIAGNOSIS — M542 Cervicalgia: Secondary | ICD-10-CM | POA: Diagnosis not present

## 2018-06-27 DIAGNOSIS — Z0000 Encounter for general adult medical examination without abnormal findings: Secondary | ICD-10-CM | POA: Diagnosis not present

## 2018-06-27 DIAGNOSIS — Z6832 Body mass index (BMI) 32.0-32.9, adult: Secondary | ICD-10-CM | POA: Diagnosis not present

## 2018-06-27 DIAGNOSIS — Z1211 Encounter for screening for malignant neoplasm of colon: Secondary | ICD-10-CM | POA: Diagnosis not present

## 2018-06-27 DIAGNOSIS — Z1331 Encounter for screening for depression: Secondary | ICD-10-CM | POA: Diagnosis not present

## 2018-06-27 DIAGNOSIS — R42 Dizziness and giddiness: Secondary | ICD-10-CM | POA: Diagnosis not present

## 2018-06-27 DIAGNOSIS — Z79899 Other long term (current) drug therapy: Secondary | ICD-10-CM | POA: Diagnosis not present

## 2018-06-27 DIAGNOSIS — Z1339 Encounter for screening examination for other mental health and behavioral disorders: Secondary | ICD-10-CM | POA: Diagnosis not present

## 2018-06-27 DIAGNOSIS — I10 Essential (primary) hypertension: Secondary | ICD-10-CM | POA: Diagnosis not present

## 2018-06-29 DIAGNOSIS — M542 Cervicalgia: Secondary | ICD-10-CM | POA: Diagnosis not present

## 2018-06-29 DIAGNOSIS — M4326 Fusion of spine, lumbar region: Secondary | ICD-10-CM | POA: Diagnosis not present

## 2018-06-29 DIAGNOSIS — G894 Chronic pain syndrome: Secondary | ICD-10-CM | POA: Diagnosis not present

## 2018-06-29 DIAGNOSIS — M4322 Fusion of spine, cervical region: Secondary | ICD-10-CM | POA: Diagnosis not present

## 2018-09-13 DIAGNOSIS — M4326 Fusion of spine, lumbar region: Secondary | ICD-10-CM | POA: Diagnosis not present

## 2018-09-13 DIAGNOSIS — M545 Low back pain: Secondary | ICD-10-CM | POA: Diagnosis not present

## 2018-09-13 DIAGNOSIS — G894 Chronic pain syndrome: Secondary | ICD-10-CM | POA: Diagnosis not present

## 2018-09-13 DIAGNOSIS — T84296A Other mechanical complication of internal fixation device of vertebrae, initial encounter: Secondary | ICD-10-CM | POA: Diagnosis not present

## 2018-10-05 DIAGNOSIS — H5203 Hypermetropia, bilateral: Secondary | ICD-10-CM | POA: Diagnosis not present

## 2018-10-05 DIAGNOSIS — Z0100 Encounter for examination of eyes and vision without abnormal findings: Secondary | ICD-10-CM | POA: Diagnosis not present

## 2018-12-13 DIAGNOSIS — T84296A Other mechanical complication of internal fixation device of vertebrae, initial encounter: Secondary | ICD-10-CM | POA: Diagnosis not present

## 2018-12-13 DIAGNOSIS — M4326 Fusion of spine, lumbar region: Secondary | ICD-10-CM | POA: Diagnosis not present

## 2018-12-13 DIAGNOSIS — G894 Chronic pain syndrome: Secondary | ICD-10-CM | POA: Diagnosis not present

## 2019-02-19 DIAGNOSIS — Z20828 Contact with and (suspected) exposure to other viral communicable diseases: Secondary | ICD-10-CM | POA: Diagnosis not present

## 2019-02-20 DIAGNOSIS — Z20828 Contact with and (suspected) exposure to other viral communicable diseases: Secondary | ICD-10-CM | POA: Diagnosis not present

## 2019-03-08 DIAGNOSIS — M4326 Fusion of spine, lumbar region: Secondary | ICD-10-CM | POA: Diagnosis not present

## 2019-03-08 DIAGNOSIS — M542 Cervicalgia: Secondary | ICD-10-CM | POA: Diagnosis not present

## 2019-03-08 DIAGNOSIS — T84296A Other mechanical complication of internal fixation device of vertebrae, initial encounter: Secondary | ICD-10-CM | POA: Diagnosis not present

## 2019-03-08 DIAGNOSIS — G894 Chronic pain syndrome: Secondary | ICD-10-CM | POA: Diagnosis not present

## 2019-06-11 DIAGNOSIS — G894 Chronic pain syndrome: Secondary | ICD-10-CM | POA: Diagnosis not present

## 2019-06-11 DIAGNOSIS — M545 Low back pain: Secondary | ICD-10-CM | POA: Diagnosis not present

## 2019-06-11 DIAGNOSIS — M542 Cervicalgia: Secondary | ICD-10-CM | POA: Diagnosis not present

## 2019-06-11 DIAGNOSIS — S134XXA Sprain of ligaments of cervical spine, initial encounter: Secondary | ICD-10-CM | POA: Diagnosis not present

## 2019-06-18 DIAGNOSIS — Z79891 Long term (current) use of opiate analgesic: Secondary | ICD-10-CM | POA: Diagnosis not present

## 2019-07-24 DIAGNOSIS — I10 Essential (primary) hypertension: Secondary | ICD-10-CM | POA: Diagnosis not present

## 2019-07-24 DIAGNOSIS — Z79899 Other long term (current) drug therapy: Secondary | ICD-10-CM | POA: Diagnosis not present

## 2019-07-24 DIAGNOSIS — M47817 Spondylosis without myelopathy or radiculopathy, lumbosacral region: Secondary | ICD-10-CM | POA: Diagnosis not present

## 2019-07-24 DIAGNOSIS — Z6831 Body mass index (BMI) 31.0-31.9, adult: Secondary | ICD-10-CM | POA: Diagnosis not present

## 2019-07-24 DIAGNOSIS — Z1331 Encounter for screening for depression: Secondary | ICD-10-CM | POA: Diagnosis not present

## 2019-07-24 DIAGNOSIS — Z0000 Encounter for general adult medical examination without abnormal findings: Secondary | ICD-10-CM | POA: Diagnosis not present

## 2019-09-10 DIAGNOSIS — G894 Chronic pain syndrome: Secondary | ICD-10-CM | POA: Diagnosis not present

## 2019-09-10 DIAGNOSIS — M542 Cervicalgia: Secondary | ICD-10-CM | POA: Diagnosis not present

## 2019-09-10 DIAGNOSIS — M545 Low back pain: Secondary | ICD-10-CM | POA: Diagnosis not present

## 2019-12-03 DIAGNOSIS — M542 Cervicalgia: Secondary | ICD-10-CM | POA: Diagnosis not present

## 2019-12-03 DIAGNOSIS — M545 Low back pain: Secondary | ICD-10-CM | POA: Diagnosis not present

## 2019-12-03 DIAGNOSIS — G894 Chronic pain syndrome: Secondary | ICD-10-CM | POA: Diagnosis not present

## 2019-12-14 DIAGNOSIS — E785 Hyperlipidemia, unspecified: Secondary | ICD-10-CM | POA: Diagnosis not present

## 2019-12-14 DIAGNOSIS — I10 Essential (primary) hypertension: Secondary | ICD-10-CM | POA: Diagnosis not present

## 2020-01-14 DIAGNOSIS — M47812 Spondylosis without myelopathy or radiculopathy, cervical region: Secondary | ICD-10-CM | POA: Diagnosis not present

## 2020-01-14 DIAGNOSIS — I10 Essential (primary) hypertension: Secondary | ICD-10-CM | POA: Diagnosis not present

## 2020-03-04 DIAGNOSIS — M545 Low back pain: Secondary | ICD-10-CM | POA: Diagnosis not present

## 2020-03-04 DIAGNOSIS — M542 Cervicalgia: Secondary | ICD-10-CM | POA: Diagnosis not present

## 2020-03-04 DIAGNOSIS — G894 Chronic pain syndrome: Secondary | ICD-10-CM | POA: Diagnosis not present

## 2020-03-15 DIAGNOSIS — M47812 Spondylosis without myelopathy or radiculopathy, cervical region: Secondary | ICD-10-CM | POA: Diagnosis not present

## 2020-03-15 DIAGNOSIS — I10 Essential (primary) hypertension: Secondary | ICD-10-CM | POA: Diagnosis not present

## 2020-03-19 DIAGNOSIS — Z6831 Body mass index (BMI) 31.0-31.9, adult: Secondary | ICD-10-CM | POA: Diagnosis not present

## 2020-03-19 DIAGNOSIS — M545 Low back pain: Secondary | ICD-10-CM | POA: Diagnosis not present

## 2020-03-19 DIAGNOSIS — M542 Cervicalgia: Secondary | ICD-10-CM | POA: Diagnosis not present

## 2020-03-19 DIAGNOSIS — G894 Chronic pain syndrome: Secondary | ICD-10-CM | POA: Diagnosis not present

## 2020-05-05 DIAGNOSIS — M545 Low back pain: Secondary | ICD-10-CM | POA: Diagnosis not present

## 2020-05-05 DIAGNOSIS — M542 Cervicalgia: Secondary | ICD-10-CM | POA: Diagnosis not present

## 2020-05-05 DIAGNOSIS — G894 Chronic pain syndrome: Secondary | ICD-10-CM | POA: Diagnosis not present

## 2020-05-15 DIAGNOSIS — I10 Essential (primary) hypertension: Secondary | ICD-10-CM | POA: Diagnosis not present

## 2020-05-15 DIAGNOSIS — M47812 Spondylosis without myelopathy or radiculopathy, cervical region: Secondary | ICD-10-CM | POA: Diagnosis not present

## 2020-06-14 DIAGNOSIS — M47812 Spondylosis without myelopathy or radiculopathy, cervical region: Secondary | ICD-10-CM | POA: Diagnosis not present

## 2020-06-14 DIAGNOSIS — I10 Essential (primary) hypertension: Secondary | ICD-10-CM | POA: Diagnosis not present

## 2020-08-04 DIAGNOSIS — M4716 Other spondylosis with myelopathy, lumbar region: Secondary | ICD-10-CM | POA: Diagnosis not present

## 2020-08-04 DIAGNOSIS — M4326 Fusion of spine, lumbar region: Secondary | ICD-10-CM | POA: Diagnosis not present

## 2020-08-04 DIAGNOSIS — G894 Chronic pain syndrome: Secondary | ICD-10-CM | POA: Diagnosis not present

## 2020-08-14 DIAGNOSIS — I10 Essential (primary) hypertension: Secondary | ICD-10-CM | POA: Diagnosis not present

## 2020-08-14 DIAGNOSIS — R42 Dizziness and giddiness: Secondary | ICD-10-CM | POA: Diagnosis not present

## 2020-08-14 DIAGNOSIS — M47817 Spondylosis without myelopathy or radiculopathy, lumbosacral region: Secondary | ICD-10-CM | POA: Diagnosis not present

## 2020-09-14 DIAGNOSIS — I10 Essential (primary) hypertension: Secondary | ICD-10-CM | POA: Diagnosis not present

## 2020-09-14 DIAGNOSIS — M47812 Spondylosis without myelopathy or radiculopathy, cervical region: Secondary | ICD-10-CM | POA: Diagnosis not present

## 2020-10-13 DIAGNOSIS — I10 Essential (primary) hypertension: Secondary | ICD-10-CM | POA: Diagnosis not present

## 2020-10-13 DIAGNOSIS — M47812 Spondylosis without myelopathy or radiculopathy, cervical region: Secondary | ICD-10-CM | POA: Diagnosis not present

## 2020-10-14 DIAGNOSIS — Z6833 Body mass index (BMI) 33.0-33.9, adult: Secondary | ICD-10-CM | POA: Diagnosis not present

## 2020-10-14 DIAGNOSIS — M47817 Spondylosis without myelopathy or radiculopathy, lumbosacral region: Secondary | ICD-10-CM | POA: Diagnosis not present

## 2020-10-14 DIAGNOSIS — Z79899 Other long term (current) drug therapy: Secondary | ICD-10-CM | POA: Diagnosis not present

## 2020-10-14 DIAGNOSIS — I10 Essential (primary) hypertension: Secondary | ICD-10-CM | POA: Diagnosis not present

## 2020-10-28 DIAGNOSIS — G894 Chronic pain syndrome: Secondary | ICD-10-CM | POA: Diagnosis not present

## 2020-10-28 DIAGNOSIS — M4326 Fusion of spine, lumbar region: Secondary | ICD-10-CM | POA: Diagnosis not present

## 2020-10-28 DIAGNOSIS — M4716 Other spondylosis with myelopathy, lumbar region: Secondary | ICD-10-CM | POA: Diagnosis not present

## 2020-12-13 DIAGNOSIS — M47812 Spondylosis without myelopathy or radiculopathy, cervical region: Secondary | ICD-10-CM | POA: Diagnosis not present

## 2020-12-13 DIAGNOSIS — I10 Essential (primary) hypertension: Secondary | ICD-10-CM | POA: Diagnosis not present

## 2021-01-29 DIAGNOSIS — G894 Chronic pain syndrome: Secondary | ICD-10-CM | POA: Diagnosis not present

## 2021-01-29 DIAGNOSIS — M4326 Fusion of spine, lumbar region: Secondary | ICD-10-CM | POA: Diagnosis not present

## 2021-01-29 DIAGNOSIS — M4716 Other spondylosis with myelopathy, lumbar region: Secondary | ICD-10-CM | POA: Diagnosis not present

## 2021-04-15 DIAGNOSIS — M47812 Spondylosis without myelopathy or radiculopathy, cervical region: Secondary | ICD-10-CM | POA: Diagnosis not present

## 2021-04-15 DIAGNOSIS — I10 Essential (primary) hypertension: Secondary | ICD-10-CM | POA: Diagnosis not present

## 2021-04-30 DIAGNOSIS — G894 Chronic pain syndrome: Secondary | ICD-10-CM | POA: Diagnosis not present

## 2021-04-30 DIAGNOSIS — M4326 Fusion of spine, lumbar region: Secondary | ICD-10-CM | POA: Diagnosis not present

## 2021-04-30 DIAGNOSIS — M4716 Other spondylosis with myelopathy, lumbar region: Secondary | ICD-10-CM | POA: Diagnosis not present

## 2021-05-25 DIAGNOSIS — M5124 Other intervertebral disc displacement, thoracic region: Secondary | ICD-10-CM | POA: Diagnosis not present

## 2021-05-25 DIAGNOSIS — M4324 Fusion of spine, thoracic region: Secondary | ICD-10-CM | POA: Diagnosis not present

## 2021-05-25 DIAGNOSIS — M5126 Other intervertebral disc displacement, lumbar region: Secondary | ICD-10-CM | POA: Diagnosis not present

## 2021-05-25 DIAGNOSIS — M4326 Fusion of spine, lumbar region: Secondary | ICD-10-CM | POA: Diagnosis not present

## 2021-07-22 DIAGNOSIS — M7061 Trochanteric bursitis, right hip: Secondary | ICD-10-CM | POA: Diagnosis not present

## 2021-07-22 DIAGNOSIS — M4325 Fusion of spine, thoracolumbar region: Secondary | ICD-10-CM | POA: Diagnosis not present

## 2021-07-22 DIAGNOSIS — Z6831 Body mass index (BMI) 31.0-31.9, adult: Secondary | ICD-10-CM | POA: Diagnosis not present

## 2021-07-22 DIAGNOSIS — G894 Chronic pain syndrome: Secondary | ICD-10-CM | POA: Diagnosis not present

## 2021-10-22 DIAGNOSIS — N1832 Chronic kidney disease, stage 3b: Secondary | ICD-10-CM | POA: Diagnosis not present

## 2021-10-22 DIAGNOSIS — Z1331 Encounter for screening for depression: Secondary | ICD-10-CM | POA: Diagnosis not present

## 2021-10-22 DIAGNOSIS — Z0000 Encounter for general adult medical examination without abnormal findings: Secondary | ICD-10-CM | POA: Diagnosis not present

## 2021-10-22 DIAGNOSIS — I10 Essential (primary) hypertension: Secondary | ICD-10-CM | POA: Diagnosis not present

## 2021-10-22 DIAGNOSIS — Z6832 Body mass index (BMI) 32.0-32.9, adult: Secondary | ICD-10-CM | POA: Diagnosis not present

## 2022-03-02 DIAGNOSIS — N1832 Chronic kidney disease, stage 3b: Secondary | ICD-10-CM | POA: Diagnosis not present

## 2022-03-02 DIAGNOSIS — Z9181 History of falling: Secondary | ICD-10-CM | POA: Diagnosis not present

## 2022-03-02 DIAGNOSIS — Z23 Encounter for immunization: Secondary | ICD-10-CM | POA: Diagnosis not present

## 2022-03-02 DIAGNOSIS — Z021 Encounter for pre-employment examination: Secondary | ICD-10-CM | POA: Diagnosis not present

## 2022-03-02 DIAGNOSIS — Z6832 Body mass index (BMI) 32.0-32.9, adult: Secondary | ICD-10-CM | POA: Diagnosis not present

## 2022-03-02 DIAGNOSIS — I10 Essential (primary) hypertension: Secondary | ICD-10-CM | POA: Diagnosis not present

## 2022-03-02 DIAGNOSIS — S91019A Laceration without foreign body, unspecified ankle, initial encounter: Secondary | ICD-10-CM | POA: Diagnosis not present

## 2022-08-05 DIAGNOSIS — S300XXA Contusion of lower back and pelvis, initial encounter: Secondary | ICD-10-CM | POA: Diagnosis not present

## 2022-08-05 DIAGNOSIS — Z6833 Body mass index (BMI) 33.0-33.9, adult: Secondary | ICD-10-CM | POA: Diagnosis not present

## 2022-10-24 DIAGNOSIS — S93401A Sprain of unspecified ligament of right ankle, initial encounter: Secondary | ICD-10-CM | POA: Diagnosis not present

## 2022-11-27 DIAGNOSIS — H1031 Unspecified acute conjunctivitis, right eye: Secondary | ICD-10-CM | POA: Diagnosis not present

## 2022-12-15 DIAGNOSIS — M545 Low back pain, unspecified: Secondary | ICD-10-CM | POA: Diagnosis not present

## 2022-12-15 DIAGNOSIS — G894 Chronic pain syndrome: Secondary | ICD-10-CM | POA: Diagnosis not present

## 2022-12-15 DIAGNOSIS — M4325 Fusion of spine, thoracolumbar region: Secondary | ICD-10-CM | POA: Diagnosis not present

## 2022-12-15 DIAGNOSIS — M47896 Other spondylosis, lumbar region: Secondary | ICD-10-CM | POA: Diagnosis not present

## 2022-12-19 DIAGNOSIS — B029 Zoster without complications: Secondary | ICD-10-CM | POA: Diagnosis not present

## 2022-12-19 DIAGNOSIS — R21 Rash and other nonspecific skin eruption: Secondary | ICD-10-CM | POA: Diagnosis not present

## 2023-02-21 DIAGNOSIS — H524 Presbyopia: Secondary | ICD-10-CM | POA: Diagnosis not present

## 2023-02-21 DIAGNOSIS — H2513 Age-related nuclear cataract, bilateral: Secondary | ICD-10-CM | POA: Diagnosis not present

## 2023-02-21 DIAGNOSIS — H52223 Regular astigmatism, bilateral: Secondary | ICD-10-CM | POA: Diagnosis not present

## 2023-02-21 DIAGNOSIS — H5203 Hypermetropia, bilateral: Secondary | ICD-10-CM | POA: Diagnosis not present

## 2023-05-30 DIAGNOSIS — Z1339 Encounter for screening examination for other mental health and behavioral disorders: Secondary | ICD-10-CM | POA: Diagnosis not present

## 2023-05-30 DIAGNOSIS — Z0000 Encounter for general adult medical examination without abnormal findings: Secondary | ICD-10-CM | POA: Diagnosis not present

## 2023-05-30 DIAGNOSIS — I10 Essential (primary) hypertension: Secondary | ICD-10-CM | POA: Diagnosis not present

## 2023-05-30 DIAGNOSIS — Z1331 Encounter for screening for depression: Secondary | ICD-10-CM | POA: Diagnosis not present

## 2023-05-30 DIAGNOSIS — N1832 Chronic kidney disease, stage 3b: Secondary | ICD-10-CM | POA: Diagnosis not present

## 2023-05-30 DIAGNOSIS — Z79899 Other long term (current) drug therapy: Secondary | ICD-10-CM | POA: Diagnosis not present

## 2023-05-30 DIAGNOSIS — Z9181 History of falling: Secondary | ICD-10-CM | POA: Diagnosis not present

## 2023-05-30 DIAGNOSIS — Z6834 Body mass index (BMI) 34.0-34.9, adult: Secondary | ICD-10-CM | POA: Diagnosis not present

## 2023-05-30 DIAGNOSIS — G4761 Periodic limb movement disorder: Secondary | ICD-10-CM | POA: Diagnosis not present

## 2023-07-18 DIAGNOSIS — R059 Cough, unspecified: Secondary | ICD-10-CM | POA: Diagnosis not present

## 2023-07-18 DIAGNOSIS — R110 Nausea: Secondary | ICD-10-CM | POA: Diagnosis not present

## 2023-07-18 DIAGNOSIS — R5383 Other fatigue: Secondary | ICD-10-CM | POA: Diagnosis not present

## 2023-07-18 DIAGNOSIS — R0981 Nasal congestion: Secondary | ICD-10-CM | POA: Diagnosis not present

## 2023-07-18 DIAGNOSIS — R509 Fever, unspecified: Secondary | ICD-10-CM | POA: Diagnosis not present

## 2023-07-22 DIAGNOSIS — J189 Pneumonia, unspecified organism: Secondary | ICD-10-CM | POA: Diagnosis not present

## 2023-07-22 DIAGNOSIS — Z6834 Body mass index (BMI) 34.0-34.9, adult: Secondary | ICD-10-CM | POA: Diagnosis not present
